# Patient Record
Sex: Male | Born: 1965 | Race: Black or African American | Hispanic: No | Marital: Married | Smoking: Former smoker
Health system: Southern US, Community
[De-identification: ages and names within clinical notes are randomized; demographics above are authoritative.]

## PROBLEM LIST (undated history)

## (undated) DIAGNOSIS — E7800 Pure hypercholesterolemia, unspecified: Secondary | ICD-10-CM

## (undated) DIAGNOSIS — S43429A Sprain of unspecified rotator cuff capsule, initial encounter: Secondary | ICD-10-CM

## (undated) DIAGNOSIS — M1990 Unspecified osteoarthritis, unspecified site: Secondary | ICD-10-CM

## (undated) HISTORY — PX: SHOULDER ARTHROSCOPY: SHX128

---

## 2009-08-15 ENCOUNTER — Emergency Department (HOSPITAL_BASED_OUTPATIENT_CLINIC_OR_DEPARTMENT_OTHER): Admission: EM | Admit: 2009-08-15 | Discharge: 2009-08-15 | Payer: Self-pay | Admitting: Emergency Medicine

## 2009-11-21 ENCOUNTER — Encounter: Admission: RE | Admit: 2009-11-21 | Discharge: 2009-11-21 | Payer: Self-pay | Admitting: Orthopedic Surgery

## 2010-08-13 HISTORY — PX: HAND SURGERY: SHX662

## 2010-09-03 ENCOUNTER — Encounter: Payer: Self-pay | Admitting: Orthopedic Surgery

## 2011-08-14 DIAGNOSIS — S43429A Sprain of unspecified rotator cuff capsule, initial encounter: Secondary | ICD-10-CM

## 2011-08-14 HISTORY — DX: Sprain of unspecified rotator cuff capsule, initial encounter: S43.429A

## 2012-01-07 ENCOUNTER — Emergency Department (HOSPITAL_BASED_OUTPATIENT_CLINIC_OR_DEPARTMENT_OTHER)
Admission: EM | Admit: 2012-01-07 | Discharge: 2012-01-07 | Disposition: A | Discharge status: Home / Self Care | Payer: 59 | Attending: Emergency Medicine | Admitting: Emergency Medicine

## 2012-01-07 ENCOUNTER — Encounter (HOSPITAL_BASED_OUTPATIENT_CLINIC_OR_DEPARTMENT_OTHER): Payer: Self-pay | Admitting: *Deleted

## 2012-01-07 DIAGNOSIS — K0510 Chronic gingivitis, plaque induced: Secondary | ICD-10-CM

## 2012-01-07 DIAGNOSIS — E7800 Pure hypercholesterolemia, unspecified: Secondary | ICD-10-CM | POA: Insufficient documentation

## 2012-01-07 DIAGNOSIS — K047 Periapical abscess without sinus: Secondary | ICD-10-CM

## 2012-01-07 DIAGNOSIS — K056 Periodontal disease, unspecified: Secondary | ICD-10-CM | POA: Insufficient documentation

## 2012-01-07 HISTORY — DX: Pure hypercholesterolemia, unspecified: E78.00

## 2012-01-07 MED ORDER — CHLORHEXIDINE GLUCONATE 0.12 % MT SOLN: 15.0000 mL | Freq: Two times a day (BID) | OROMUCOSAL | Status: AC

## 2012-01-07 MED ORDER — HYDROCODONE-ACETAMINOPHEN 5-325 MG PO TABS: 1.0000 | ORAL_TABLET | Freq: Four times a day (QID) | ORAL | Status: AC | PRN

## 2012-01-07 MED ORDER — PENICILLIN V POTASSIUM 500 MG PO TABS: 500.0000 mg | ORAL_TABLET | Freq: Four times a day (QID) | ORAL | Status: AC

## 2012-01-07 NOTE — ED Notes (Signed)
Pt amb to triage with quick steady gait in nad. Pt reports left lower oral swelling to his gums x yesterday, denies any fevers or other c/o.

## 2012-01-07 NOTE — Discharge Instructions (Signed)
Dental Pain  A tooth ache may be caused by cavities (tooth decay). Cavities expose the nerve of the tooth to air and hot or cold temperatures. It may come from an infection or abscess (also called a boil or furuncle) around your tooth. It is also often caused by dental caries (tooth decay). This causes the pain you are having.  DIAGNOSIS   Your caregiver can diagnose this problem by exam.  TREATMENT   · If caused by an infection, it may be treated with medications which kill germs (antibiotics) and pain medications as prescribed by your caregiver. Take medications as directed.  · Only take over-the-counter or prescription medicines for pain, discomfort, or fever as directed by your caregiver.  · Whether the tooth ache today is caused by infection or dental disease, you should see your dentist as soon as possible for further care.  SEEK MEDICAL CARE IF:  The exam and treatment you received today has been provided on an emergency basis only. This is not a substitute for complete medical or dental care. If your problem worsens or new problems (symptoms) appear, and you are unable to meet with your dentist, call or return to this location.  SEEK IMMEDIATE MEDICAL CARE IF:   · You have a fever.  · You develop redness and swelling of your face, jaw, or neck.  · You are unable to open your mouth.  · You have severe pain uncontrolled by pain medicine.  MAKE SURE YOU:   · Understand these instructions.  · Will watch your condition.  · Will get help right away if you are not doing well or get worse.  Document Released: 07/30/2005 Document Revised: 07/19/2011 Document Reviewed: 03/17/2008  Exit[employer]® Patient Information ©2012 [employer].

## 2012-01-07 NOTE — ED Provider Notes (Signed)
I saw and evaluated the patient, reviewed the resident's note and I agree with the findings and plan.  Pt with gum disease, periodontal disease, mild facial swelling, without trismus, fever, and no obvious gumline fluctuance that is amenable to I&D in my opinion.  Oral abx and analgesics are ok.  Pt has an appt with a dentist tomorrow by his report.    Gavin Pound. Ulysees Robarts, MD 01/07/12 1512

## 2012-01-07 NOTE — ED Notes (Signed)
rx x 3 given for peridex, norco and pcn- d/c with ride

## 2012-01-07 NOTE — ED Provider Notes (Signed)
History     CSN: [id number]  Arrival date & time 01/07/12  1411   First MD Initiated Contact with Patient 01/07/12 1436      Chief Complaint  Patient presents with  . Oral Swelling    (Consider location/radiation/quality/duration/timing/severity/associated sxs/prior treatment) HPI 46 yo male with history of tooth and gum disease who presents with complaint of gum swelling and pain for the past couple of days.  Is planning on having some teeth extracted within the next couple of weeks once he gets the money.  He denies any difficulty swallowing, fever, nausea.   Past Medical History  Diagnosis Date  . Hypercholesteremia     History reviewed. No pertinent past surgical history.  History reviewed. No pertinent family history.  History  Substance Use Topics  . Smoking status: Not on file  . Smokeless tobacco: Not on file  . Alcohol Use:       Review of Systems  HENT: Positive for dental problem. Negative for sore throat, facial swelling, drooling and trouble swallowing.   Respiratory: Negative for shortness of breath and stridor.   Cardiovascular: Negative for chest pain.    Allergies  Review of patient's allergies indicates no known allergies.  Home Medications   Current Outpatient Rx  Name Route Sig Dispense Refill  . PHENTERMINE HCL 15 MG PO CAPS Oral Take 15 mg by mouth every morning.      BP 125/66  Pulse 82  Temp(Src) 98.5 F (36.9 C) (Oral)  Resp 18  Ht 5\' 11"  (1.803 m)  Wt 242 lb (109.77 kg)  BMI 33.75 kg/m2  SpO2 100%  Physical Exam  Constitutional: He is oriented to person, place, and time. He appears well-developed and well-nourished.  HENT:  Head: Normocephalic and atraumatic.       Fair dentition with evidence of gum disease.  There is swelling and erosion around the lower gum line on the L without, no visible pocket of pus to be drained.   No jaw swelling or tenderness.     Neck: Neck supple.  Lymphadenopathy:    He has no cervical  adenopathy.  Neurological: He is alert and oriented to person, place, and time.    ED Course  Procedures (including critical care time)  Labs Reviewed - No data to display No results found.   No diagnosis found.    MDM  Gingival disease, with possible abscess.  Swelling present, but unable to visualize area that would be amenable to drainage.  Will place on penicillin and hydrocodone for pain control.  Will give CHG mouthwash as well to help in recurrence of symptoms.          Everrett Coombe, DO 01/07/12 1509  Everrett Coombe, DO 01/07/12 [id number]

## 2012-08-10 ENCOUNTER — Emergency Department (HOSPITAL_BASED_OUTPATIENT_CLINIC_OR_DEPARTMENT_OTHER)
Admission: EM | Admit: 2012-08-10 | Discharge: 2012-08-10 | Disposition: A | Discharge status: Home / Self Care | Payer: 59 | Attending: Emergency Medicine | Admitting: Emergency Medicine

## 2012-08-10 ENCOUNTER — Emergency Department (HOSPITAL_BASED_OUTPATIENT_CLINIC_OR_DEPARTMENT_OTHER): Payer: 59

## 2012-08-10 DIAGNOSIS — E7800 Pure hypercholesterolemia, unspecified: Secondary | ICD-10-CM | POA: Insufficient documentation

## 2012-08-10 DIAGNOSIS — M25519 Pain in unspecified shoulder: Secondary | ICD-10-CM | POA: Insufficient documentation

## 2012-08-10 DIAGNOSIS — Z79899 Other long term (current) drug therapy: Secondary | ICD-10-CM | POA: Insufficient documentation

## 2012-08-10 MED ORDER — PREDNISONE 10 MG PO TABS: 20.0000 mg | ORAL_TABLET | Freq: Two times a day (BID) | ORAL | Status: DC

## 2012-08-10 MED ORDER — HYDROCODONE-ACETAMINOPHEN 5-500 MG PO TABS: 1.0000 | ORAL_TABLET | Freq: Four times a day (QID) | ORAL | Status: AC | PRN

## 2012-08-10 MED ORDER — KETOROLAC TROMETHAMINE 60 MG/2ML IM SOLN
60.0000 mg | Freq: Once | INTRAMUSCULAR | Status: AC
  Administered 2012-08-10: 60 mg via INTRAMUSCULAR
  Filled 2012-08-10: qty 2

## 2012-08-10 NOTE — ED Notes (Signed)
Patient C/O pain in his left shoulder that has been intermittent.  States that left shoulder pain returned last week. States that his rage of motion is limited.  States that he thinks that his left arm is getting weaker.  Left shoulder is non-tender to palpation.

## 2012-08-10 NOTE — ED Provider Notes (Signed)
History     CSN: [id number]  Arrival date & time 08/10/12  1006   First MD Initiated Contact with Patient 08/10/12 1032      Chief Complaint  Patient presents with  . Shoulder Pain    (Consider location/radiation/quality/duration/timing/severity/associated sxs/prior treatment) HPI Comments: Patient with history of longstanding shoulder pain.  He presents with a worsening over the past two days.  He denies any injury or trauma.  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  No numbness or tingling.    Patient is a 46 y.o. male presenting with shoulder pain. The history is provided by the patient.  Shoulder Pain This is a chronic problem. The current episode started 2 days ago. The problem occurs constantly. The problem has been rapidly worsening. Pertinent negatives include no chest pain. Exacerbated by: movement, palpation. Nothing relieves the symptoms. He has tried nothing for the symptoms.    Past Medical History  Diagnosis Date  . Hypercholesteremia     No past surgical history on file.  No family history on file.  History  Substance Use Topics  . Smoking status: Not on file  . Smokeless tobacco: Not on file  . Alcohol Use:       Review of Systems  Cardiovascular: Negative for chest pain.  All other systems reviewed and are negative.    Allergies  Review of patient's allergies indicates no known allergies.  Home Medications   Current Outpatient Rx  Name  Route  Sig  Dispense  Refill  . PHENTERMINE HCL 15 MG PO CAPS   Oral   Take 15 mg by mouth every morning.           BP 156/76  Pulse 88  Temp 98 F (36.7 C) (Oral)  Resp 18  Ht 5\' 11"  (1.803 m)  Wt 240 lb (108.863 kg)  BMI 33.47 kg/m2  SpO2 98%  Physical Exam  Nursing note and vitals reviewed. Constitutional: He is oriented to person, place, and time. He appears well-developed and well-nourished. No distress.  HENT:  Head: Normocephalic and atraumatic.  Neck:  Normal range of motion. Neck supple.  Musculoskeletal:       There is ttp over the lateral aspect of the shoulder.  There is no gross abnormality.  There is good range of motion with pain with internal rotation.  The distal pulses, motor, and sensory are all intact.  Neurological: He is alert and oriented to person, place, and time.  Skin: Skin is warm and dry. He is not diaphoretic.    ED Course  Procedures (including critical care time)  Labs Reviewed - No data to display Dg Shoulder Left  08/10/2012  *RADIOLOGY REPORT*  Clinical Data: Shoulder pain  LEFT SHOULDER - 2+ VIEW  Comparison: None  Findings: There is no evidence of fracture or dislocation. Mild osteoarthritis involves the glenohumeral joint.  Soft tissues are unremarkable.  IMPRESSION: No acute findings.   Original Report Authenticated By: Signa Kell, M.D.      No diagnosis found.    MDM  The xrays look okay.  Will treat with prednisone, pain meds, rest.  Will also apply sling to rest the arm.  Follow up prn with pcp if not improving to discuss referrals/further testing as indicated.          Geoffery Lyons, MD 08/10/12 1130

## 2013-04-02 ENCOUNTER — Ambulatory Visit: Payer: 59 | Attending: Specialist | Admitting: Rehabilitation

## 2013-04-02 DIAGNOSIS — M25619 Stiffness of unspecified shoulder, not elsewhere classified: Secondary | ICD-10-CM | POA: Insufficient documentation

## 2013-04-02 DIAGNOSIS — IMO0001 Reserved for inherently not codable concepts without codable children: Secondary | ICD-10-CM | POA: Insufficient documentation

## 2013-04-02 DIAGNOSIS — M25519 Pain in unspecified shoulder: Secondary | ICD-10-CM | POA: Insufficient documentation

## 2013-04-07 ENCOUNTER — Ambulatory Visit: Payer: 59 | Admitting: Physical Therapy

## 2013-04-09 ENCOUNTER — Ambulatory Visit: Payer: 59 | Admitting: Physical Therapy

## 2013-04-14 ENCOUNTER — Ambulatory Visit: Payer: 59 | Admitting: Physical Therapy

## 2013-04-16 ENCOUNTER — Ambulatory Visit: Payer: 59 | Admitting: Physical Therapy

## 2013-04-17 ENCOUNTER — Ambulatory Visit: Payer: 59 | Attending: Specialist | Admitting: Physical Therapy

## 2013-04-17 DIAGNOSIS — IMO0001 Reserved for inherently not codable concepts without codable children: Secondary | ICD-10-CM | POA: Insufficient documentation

## 2013-04-17 DIAGNOSIS — M25619 Stiffness of unspecified shoulder, not elsewhere classified: Secondary | ICD-10-CM | POA: Insufficient documentation

## 2013-04-17 DIAGNOSIS — M25519 Pain in unspecified shoulder: Secondary | ICD-10-CM | POA: Insufficient documentation

## 2013-04-20 ENCOUNTER — Ambulatory Visit: Payer: 59 | Admitting: Physical Therapy

## 2013-04-21 ENCOUNTER — Ambulatory Visit: Payer: 59 | Admitting: Physical Therapy

## 2013-04-23 ENCOUNTER — Ambulatory Visit: Payer: 59 | Admitting: Physical Therapy

## 2013-04-28 ENCOUNTER — Ambulatory Visit: Payer: 59 | Admitting: Physical Therapy

## 2013-04-30 ENCOUNTER — Ambulatory Visit: Payer: 59 | Admitting: Physical Therapy

## 2013-05-04 ENCOUNTER — Ambulatory Visit: Payer: 59 | Admitting: Physical Therapy

## 2013-05-06 ENCOUNTER — Ambulatory Visit: Payer: 59 | Admitting: Physical Therapy

## 2013-05-07 ENCOUNTER — Ambulatory Visit: Payer: 59 | Admitting: Physical Therapy

## 2013-05-18 ENCOUNTER — Ambulatory Visit: Payer: 59 | Attending: Specialist | Admitting: Physical Therapy

## 2013-05-18 DIAGNOSIS — IMO0001 Reserved for inherently not codable concepts without codable children: Secondary | ICD-10-CM | POA: Insufficient documentation

## 2013-05-18 DIAGNOSIS — M25619 Stiffness of unspecified shoulder, not elsewhere classified: Secondary | ICD-10-CM | POA: Insufficient documentation

## 2013-05-18 DIAGNOSIS — M25519 Pain in unspecified shoulder: Secondary | ICD-10-CM | POA: Insufficient documentation

## 2013-05-20 ENCOUNTER — Ambulatory Visit: Payer: 59 | Admitting: Physical Therapy

## 2013-05-21 ENCOUNTER — Ambulatory Visit: Payer: 59 | Admitting: Physical Therapy

## 2013-05-25 ENCOUNTER — Ambulatory Visit: Payer: 59 | Admitting: Physical Therapy

## 2013-05-29 ENCOUNTER — Ambulatory Visit: Payer: 59 | Admitting: Physical Therapy

## 2014-11-11 DIAGNOSIS — M75122 Complete rotator cuff tear or rupture of left shoulder, not specified as traumatic: Secondary | ICD-10-CM | POA: Insufficient documentation

## 2014-11-11 DIAGNOSIS — S46119A Strain of muscle, fascia and tendon of long head of biceps, unspecified arm, initial encounter: Secondary | ICD-10-CM | POA: Insufficient documentation

## 2014-11-11 DIAGNOSIS — M7540 Impingement syndrome of unspecified shoulder: Secondary | ICD-10-CM | POA: Insufficient documentation

## 2014-11-11 DIAGNOSIS — M19012 Primary osteoarthritis, left shoulder: Secondary | ICD-10-CM | POA: Insufficient documentation

## 2015-01-28 DIAGNOSIS — Z01818 Encounter for other preprocedural examination: Secondary | ICD-10-CM | POA: Insufficient documentation

## 2015-01-28 DIAGNOSIS — M25512 Pain in left shoulder: Secondary | ICD-10-CM | POA: Insufficient documentation

## 2016-04-15 ENCOUNTER — Emergency Department (HOSPITAL_BASED_OUTPATIENT_CLINIC_OR_DEPARTMENT_OTHER)
Admission: EM | Admit: 2016-04-15 | Discharge: 2016-04-15 | Disposition: A | Discharge status: Home / Self Care | Payer: 59 | Attending: Emergency Medicine | Admitting: Emergency Medicine

## 2016-04-15 ENCOUNTER — Encounter (HOSPITAL_BASED_OUTPATIENT_CLINIC_OR_DEPARTMENT_OTHER): Payer: Self-pay | Admitting: *Deleted

## 2016-04-15 DIAGNOSIS — Z79899 Other long term (current) drug therapy: Secondary | ICD-10-CM | POA: Diagnosis not present

## 2016-04-15 DIAGNOSIS — S4992XA Unspecified injury of left shoulder and upper arm, initial encounter: Secondary | ICD-10-CM | POA: Diagnosis present

## 2016-04-15 DIAGNOSIS — Y92410 Unspecified street and highway as the place of occurrence of the external cause: Secondary | ICD-10-CM | POA: Insufficient documentation

## 2016-04-15 DIAGNOSIS — Y939 Activity, unspecified: Secondary | ICD-10-CM | POA: Diagnosis not present

## 2016-04-15 DIAGNOSIS — Y999 Unspecified external cause status: Secondary | ICD-10-CM | POA: Diagnosis not present

## 2016-04-15 DIAGNOSIS — Z87891 Personal history of nicotine dependence: Secondary | ICD-10-CM | POA: Insufficient documentation

## 2016-04-15 DIAGNOSIS — S46912A Strain of unspecified muscle, fascia and tendon at shoulder and upper arm level, left arm, initial encounter: Secondary | ICD-10-CM | POA: Insufficient documentation

## 2016-04-15 HISTORY — DX: Sprain of unspecified rotator cuff capsule, initial encounter: S43.429A

## 2016-04-15 MED ORDER — METHOCARBAMOL 500 MG PO TABS
500.0000 mg | ORAL_TABLET | Freq: Once | ORAL | Status: AC
  Administered 2016-04-15: 500 mg via ORAL
  Filled 2016-04-15: qty 1

## 2016-04-15 MED ORDER — METHOCARBAMOL 500 MG PO TABS: 500.0000 mg | ORAL_TABLET | Freq: Two times a day (BID) | ORAL | 0 refills | Status: DC

## 2016-04-15 MED ORDER — KETOROLAC TROMETHAMINE 60 MG/2ML IM SOLN
60.0000 mg | Freq: Once | INTRAMUSCULAR | Status: AC
  Administered 2016-04-15: 60 mg via INTRAMUSCULAR
  Filled 2016-04-15: qty 2

## 2016-04-15 MED ORDER — DICLOFENAC SODIUM 75 MG PO TBEC: 75.0000 mg | DELAYED_RELEASE_TABLET | Freq: Two times a day (BID) | ORAL | 0 refills | Status: DC

## 2016-04-15 NOTE — ED Notes (Signed)
MD at bedside. 
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
## 2016-04-24 ENCOUNTER — Ambulatory Visit: Payer: 59 | Admitting: Family Medicine

## 2016-04-25 ENCOUNTER — Encounter: Payer: Self-pay | Admitting: Family Medicine

## 2016-04-25 ENCOUNTER — Ambulatory Visit (HOSPITAL_BASED_OUTPATIENT_CLINIC_OR_DEPARTMENT_OTHER)
Admission: RE | Admit: 2016-04-25 | Discharge: 2016-04-25 | Disposition: A | Discharge status: Home / Self Care | Payer: 59 | Source: Ambulatory Visit | Attending: Family Medicine | Admitting: Family Medicine

## 2016-04-25 ENCOUNTER — Ambulatory Visit (INDEPENDENT_AMBULATORY_CARE_PROVIDER_SITE_OTHER): Payer: 59 | Admitting: Family Medicine

## 2016-04-25 VITALS — BP 109/77 | HR 128 | Ht 71.0 in | Wt 246.0 lb

## 2016-04-25 DIAGNOSIS — M25512 Pain in left shoulder: Secondary | ICD-10-CM

## 2016-04-25 MED ORDER — MELOXICAM 15 MG PO TABS: 15.0000 mg | ORAL_TABLET | Freq: Every day | ORAL | 2 refills | Status: DC

## 2016-04-25 MED ORDER — OXYCODONE-ACETAMINOPHEN 10-325 MG PO TABS: 1.0000 | ORAL_TABLET | Freq: Four times a day (QID) | ORAL | 0 refills | Status: DC | PRN

## 2016-04-25 MED ORDER — METHOCARBAMOL 500 MG PO TABS: 500.0000 mg | ORAL_TABLET | Freq: Three times a day (TID) | ORAL | 1 refills | Status: DC | PRN

## 2016-04-25 NOTE — Patient Instructions (Signed)
You have a rotator cuff strain and contusion. Take meloxicam 15 mg daily with food for pain and inflammation. Robaxin as needed for spasms. Oxycodone as needed for severe pain (we do not refill this medicine). Light duty as written in your note. We will repeat the MRI of your shoulder as well to assess for a new rotator cuff tear. Follow up will depend on your result.

## 2016-04-30 ENCOUNTER — Telehealth: Payer: Self-pay | Admitting: Family Medicine

## 2016-04-30 NOTE — Addendum Note (Signed)
Addended by: Kathi SimpersWISE, Dessiree Sze F on: 04/30/2016 09:58 AM   Modules accepted: Orders
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
## 2016-05-05 ENCOUNTER — Ambulatory Visit (HOSPITAL_BASED_OUTPATIENT_CLINIC_OR_DEPARTMENT_OTHER)
Admission: RE | Admit: 2016-05-05 | Discharge: 2016-05-05 | Disposition: A | Discharge status: Home / Self Care | Payer: 59 | Source: Ambulatory Visit | Attending: Family Medicine | Admitting: Family Medicine

## 2016-05-05 DIAGNOSIS — M75102 Unspecified rotator cuff tear or rupture of left shoulder, not specified as traumatic: Secondary | ICD-10-CM | POA: Insufficient documentation

## 2016-05-05 DIAGNOSIS — M25512 Pain in left shoulder: Secondary | ICD-10-CM | POA: Diagnosis not present

## 2016-05-05 DIAGNOSIS — M6258 Muscle wasting and atrophy, not elsewhere classified, other site: Secondary | ICD-10-CM | POA: Diagnosis not present

## 2016-05-05 DIAGNOSIS — M19012 Primary osteoarthritis, left shoulder: Secondary | ICD-10-CM | POA: Insufficient documentation

## 2016-05-05 DIAGNOSIS — M778 Other enthesopathies, not elsewhere classified: Secondary | ICD-10-CM | POA: Diagnosis not present

## 2016-05-07 ENCOUNTER — Telehealth: Payer: Self-pay | Admitting: Family Medicine

## 2016-05-07 NOTE — Telephone Encounter (Signed)
Spoke with patient - see addendum to note.

## 2016-05-07 NOTE — Addendum Note (Signed)
Addended by: Kathi SimpersWISE, Camaria Gerald F on: 05/07/2016 05:15 PM   Modules accepted: Orders

## 2016-05-07 NOTE — Telephone Encounter (Signed)
Called to give him MRI results - no answer and voicemail not set up to be able to leave a message.

## 2016-05-08 NOTE — Telephone Encounter (Signed)
MRI completed.

## 2016-05-15 ENCOUNTER — Telehealth: Payer: Self-pay | Admitting: Family Medicine

## 2016-05-15 NOTE — Telephone Encounter (Signed)
Spoke to patient and gave him phone number to  Physical Therapy. Patient to call to set up appointment.

## 2016-05-23 ENCOUNTER — Telehealth: Payer: Self-pay | Admitting: Family Medicine

## 2016-05-24 NOTE — Telephone Encounter (Signed)
If the robaxin and meloxicam aren't helping enough we can switch from these to something different (like flexeril and diclofenac).  He can take tylenol and a topical medicine like capsaicin in addition to the robaxin and meloxicam too.

## 2016-05-25 ENCOUNTER — Encounter: Payer: Self-pay | Admitting: Family Medicine

## 2016-05-25 ENCOUNTER — Ambulatory Visit: Payer: 59 | Attending: Family Medicine | Admitting: Physical Therapy

## 2016-05-25 ENCOUNTER — Encounter (INDEPENDENT_AMBULATORY_CARE_PROVIDER_SITE_OTHER): Payer: Self-pay

## 2016-05-25 ENCOUNTER — Ambulatory Visit (INDEPENDENT_AMBULATORY_CARE_PROVIDER_SITE_OTHER): Payer: 59 | Admitting: Family Medicine

## 2016-05-25 VITALS — BP 112/70 | HR 67 | Ht 71.0 in | Wt 248.0 lb

## 2016-05-25 DIAGNOSIS — M25512 Pain in left shoulder: Secondary | ICD-10-CM

## 2016-05-25 DIAGNOSIS — M25612 Stiffness of left shoulder, not elsewhere classified: Secondary | ICD-10-CM | POA: Diagnosis present

## 2016-05-25 DIAGNOSIS — R293 Abnormal posture: Secondary | ICD-10-CM

## 2016-05-25 DIAGNOSIS — M6281 Muscle weakness (generalized): Secondary | ICD-10-CM | POA: Insufficient documentation

## 2016-05-25 DIAGNOSIS — M25511 Pain in right shoulder: Secondary | ICD-10-CM | POA: Diagnosis not present

## 2016-05-25 MED ORDER — TRAMADOL HCL 50 MG PO TABS: 50.0000 mg | ORAL_TABLET | Freq: Three times a day (TID) | ORAL | 0 refills | Status: DC | PRN

## 2016-05-25 MED ORDER — METHYLPREDNISOLONE ACETATE 40 MG/ML IJ SUSP
40.0000 mg | Freq: Once | INTRAMUSCULAR | Status: AC
  Administered 2016-05-25: 40 mg via INTRA_ARTICULAR

## 2016-05-25 MED FILL — traMADol HCL 50 MG TABS: 50 | 17 days supply | Qty: 50 | Fill #0
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
## 2016-05-25 NOTE — Patient Instructions (Signed)
You have rotator cuff impingement, tendinitis of your right shoulder. You were given an injection here. Try to avoid painful activities (overhead activities, lifting with extended arm) as much as possible. Meloxicam as you have been for pain and inflammation. Robaxin as needed for spasms. Tramadol as needed for severe pain - we do not refill this medicine. Can take tylenol in addition to this. Start physical therapy with transition to home exercise program. Do home exercise program with theraband and scapular stabilization exercises daily - these are very important for long term relief even if an injection was given. Can consider nitro patches if not improving. Follow up with me in 6 weeks.

## 2016-05-28 ENCOUNTER — Ambulatory Visit: Payer: 59 | Admitting: Family Medicine

## 2016-05-30 DIAGNOSIS — M25511 Pain in right shoulder: Secondary | ICD-10-CM | POA: Insufficient documentation

## 2016-05-30 NOTE — Assessment & Plan Note (Signed)
2/2 rotator cuff impingement.  Discussed options - starting physical therapy for this as well.  Subacromial injection given today.  Consider nitro patches if not improving.  F/u in 6 weeks.  After informed written consent, patient was seated on exam table. Right shoulder was prepped with alcohol swab and utilizing posterior approach, patient's right subacromial space was injected with 3:1 marcaine: depomedrol. Patient tolerated the procedure well without immediate complications.
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
## 2016-06-01 ENCOUNTER — Ambulatory Visit: Payer: 59 | Admitting: Physical Therapy

## 2016-06-15 ENCOUNTER — Ambulatory Visit: Payer: 59 | Attending: Family Medicine | Admitting: Physical Therapy

## 2016-06-15 DIAGNOSIS — M25612 Stiffness of left shoulder, not elsewhere classified: Secondary | ICD-10-CM | POA: Insufficient documentation

## 2016-06-15 DIAGNOSIS — M6281 Muscle weakness (generalized): Secondary | ICD-10-CM | POA: Diagnosis present

## 2016-06-15 DIAGNOSIS — M25512 Pain in left shoulder: Secondary | ICD-10-CM | POA: Diagnosis present

## 2016-06-15 DIAGNOSIS — R293 Abnormal posture: Secondary | ICD-10-CM | POA: Insufficient documentation

## 2016-06-15 NOTE — Therapy (Signed)
[facility] [address]  [address] [place], [place], [address] Phone: [phone]   Fax:  [phone]  Physical Therapy Treatment  Patient Details  Name: Brandon Hodge MRN: [id number] Date of Birth: 12/29/1965 Referring Provider: Norton BlizzardShane Hudnall, MD  Encounter Date: 06/15/2016      PT End of Session - 06/15/16 0846    Visit Number 2   Number of Visits 12   Date for PT Re-Evaluation 07/20/16   Authorization - Number of Visits 60   PT Start Time 0846   PT Stop Time 0930   PT Time Calculation (min) 44 min   Activity Tolerance Patient tolerated treatment well   Behavior During Therapy [facility] for tasks assessed/performed      Past Medical History:  Diagnosis Date  . Hypercholesteremia   . Rotator cuff (capsule) sprain 2013    Past Surgical History:  Procedure Laterality Date  . HAND SURGERY Left 2012    There were no vitals filed for this visit.      Subjective Assessment - 06/15/16 0848    Subjective Pt reports pain has improved significantly with initial HEP, often w/o pain during the day but still up to 8/10 at night.   Patient Stated Goals "Feel better & get back to working out as much as possible"   Currently in Pain? No/denies                   [facility] Treatment/Exercise - 06/15/16 0846      Posture/Postural Control   Posture Comments Frequent cues for proper posture during HEP     Shoulder Exercises: Supine   Horizontal ABduction Both;10 reps;Theraband   Theraband Level (Shoulder Horizontal ABduction) Level 2 (Red)   Horizontal ABduction Limitations Hoolying on 1/2 FR   External Rotation AAROM;Left;10 reps   External Rotation Limitations wand with pillow under upper arm    Flexion AAROM;Left;10 reps   Flexion Limitations wand     Shoulder Exercises: Seated   Retraction AROM;Both;10 reps   External Rotation AAROM;Left;10 reps   External Rotation Limitations wand     Shoulder  Exercises: Standing   Internal Rotation AAROM;Left;10 reps   Internal Rotation Limitations wand   Flexion AAROM;Left;10 reps   Flexion Limitations Wall ladder   ABduction AAROM;Left;10 reps   ABduction Limitations wand   Row Both;10 reps;Theraband   Theraband Level (Shoulder Row) Level 3 (Green)   Retraction Both;10 reps;Theraband   Theraband Level (Shoulder Retraction) Level 3 (Green)     Shoulder Exercises: Pulleys   Flexion 2 minutes   ABduction 2 minutes     Shoulder Exercises: ROM/Strengthening   UBE (Upper Arm Bike) lvl 2.5 fwd/back x 3'      Shoulder Exercises: Stretch   Corner Stretch 2 reps;30 seconds   Corner Stretch Limitations 3 way doorway pec stretch (low/mid/high)   Other Shoulder Stretches L Shoudler IR sleeper stretch 2x30"                PT Education - 06/15/16 0930    Education provided Yes   Education Details Reviewed & updated HEP reinforcing proper postural awareness   Methods Explanation;Demonstration;Handout   Comprehension Verbalized understanding;Returned demonstration;Need further instruction          PT Short Term Goals - 06/15/16 0850      PT SHORT TERM GOAL #1   Title Independent with initial HEP by 06/08/16   Status On-going     PT SHORT TERM GOAL #  2   Title Pt will demonstrate improved awareness of neutral shoulder posture by 06/08/16   Status On-going           PT Long Term Goals - 06/15/16 0850      PT LONG TERM GOAL #1   Title Independent with advanced HEP/gym program by 07/20/16   Status On-going     PT LONG TERM GOAL #2   Title L shoulder ROM WFL w/o limitation d/t pain by 07/20/16   Status On-going     PT LONG TERM GOAL #3   Title L shoulder strength >/= 4/5 for improved function by 07/20/16   Status On-going     PT LONG TERM GOAL #4   Title Pt will report no sleep disturbance due to L shoulder pain by 07/20/16   Status On-going     PT LONG TERM GOAL #5   Title Pt will return to 75% normal performance of  ADL's, household chores and job tasks w/o limitation due to L shoulder pain by 07/20/16   Status On-going               Plan - 06/15/16 0850    Clinical Impression Statement Pt noting improvement in pain and function with initial HEP, but upon review, pt requiring extensive cues/corrections for postural awareness and proper technique with initial HEP exercises, therefore only limited progression of HEP today.   Rehab Potential Good   PT Treatment/Interventions Patient/family education;Neuromuscular re-education;Therapeutic exercise;Manual techniques;Passive range of motion;Taping;Therapeutic activities;Electrical Stimulation;Moist Heat;Cryotherapy;Iontophoresis 4mg /ml Dexamethasone;ADLs/Self Care Home Management   PT Next Visit Plan Postural training; Scapular stabilization; L shoulder ROM & strengthening; Manual therapy & modalities PRN   Consulted and Agree with Plan of Care Patient      Patient will benefit from skilled therapeutic intervention in order to improve the following deficits and impairments:  Pain, Decreased range of motion, Postural dysfunction, Decreased strength, Increased muscle spasms, Impaired UE functional use, Decreased activity tolerance  Visit Diagnosis: Left shoulder pain, unspecified chronicity  Stiffness of left shoulder, not elsewhere classified  Muscle weakness (generalized)  Abnormal posture     Problem List Patient Active Problem List   Diagnosis Date Noted  . Right shoulder pain 05/30/2016  . Left shoulder pain 01/28/2015  . Preop examination 01/28/2015  . Arthritis of left acromioclavicular joint 11/11/2014  . Biceps rupture, proximal 11/11/2014  . Complete tear of left rotator cuff 11/11/2014  . Shoulder impingement 11/11/2014    Marry GuanJoAnne M Kreis, PT, MPT  06/15/2016, 10:42 AM  [facility] [place] [address]  [address] [place], [place], [address] Phone: [phone]   Fax:   [phone]  Name: Brandon Hodge MRN: [id number] Date of Birth: 01/02/1966

## 2016-06-18 ENCOUNTER — Ambulatory Visit: Payer: 59 | Admitting: Physical Therapy

## 2016-06-18 DIAGNOSIS — R293 Abnormal posture: Secondary | ICD-10-CM

## 2016-06-18 DIAGNOSIS — M25512 Pain in left shoulder: Secondary | ICD-10-CM | POA: Diagnosis not present

## 2016-06-18 DIAGNOSIS — M6281 Muscle weakness (generalized): Secondary | ICD-10-CM

## 2016-06-18 DIAGNOSIS — M25612 Stiffness of left shoulder, not elsewhere classified: Secondary | ICD-10-CM

## 2016-06-18 NOTE — Therapy (Signed)
[facility] [place] [address]  [address] [place], [place], [address] Phone: [phone]   Fax:  [phone]  Physical Therapy Treatment  Patient Details  Name: Brandon MandesDouglas Hodge MRN: [id number] Date of Birth: 07/17/1966 Referring Provider: Norton BlizzardShane Hudnall, MD  Encounter Date: 06/18/2016      PT End of Session - 06/18/16 1152    Visit Number 3   Number of Visits 12   Date for PT Re-Evaluation 07/20/16   Authorization - Number of Visits 60   PT Start Time 0857  patient arriving late   PT Stop Time 0930   PT Time Calculation (min) 33 min   Activity Tolerance Patient tolerated treatment well   Behavior During Therapy [facility] for tasks assessed/performed      Past Medical History:  Diagnosis Date  . Hypercholesteremia   . Rotator cuff (capsule) sprain 2013    Past Surgical History:  Procedure Laterality Date  . HAND SURGERY Left 2012    There were no vitals filed for this visit.      Subjective Assessment - 06/18/16 0859    Subjective Patient reporting shoulder is feeling well. Felt like he could raise his arm higher than normal on Saturday.    Patient Stated Goals "Feel better & get back to working out as much as possible"   Currently in Pain? No/denies                         [facility] Adult PT Treatment/Exercise - 06/18/16 0900      Posture/Postural Control   Posture Comments Frequent cues for proper posture during HEP     Shoulder Exercises: Seated   Retraction AROM;Both;10 reps   Flexion AAROM;15 reps   Flexion Limitations with cane and mirror due to excessive shoulder hiking,     Shoulder Exercises: Sidelying   External Rotation Left;15 reps;Weights   External Rotation Weight (lbs) 1   External Rotation Limitations able to go to neutral     Shoulder Exercises: Standing   External Rotation Left;15 reps;Theraband   Theraband Level (Shoulder External Rotation) Level 1 (Yellow)   Internal Rotation  Left;15 reps;Theraband   Theraband Level (Shoulder Internal Rotation) Level 1 (Yellow)   Flexion AAROM;Left;5 reps   Flexion Limitations wall ladder   ABduction AAROM;Left;10 reps   ABduction Limitations wall ladder   Row Both;15 reps;Theraband   Theraband Level (Shoulder Row) Level 3 (Green)     Shoulder Exercises: ROM/Strengthening   UBE (Upper Arm Bike) lvl 2.5 fwd/back x 3'    Wall Pushups 15 reps   Pushups Limitations 2 sets with reduced elbow extension     Shoulder Exercises: Stretch   Corner Stretch 2 reps;30 seconds   Corner Stretch Limitations 3 way doorway pec stretch (low/mid/high)                  PT Short Term Goals - 06/15/16 0850      PT SHORT TERM GOAL #1   Title Independent with initial HEP by 06/08/16   Status On-going     PT SHORT TERM GOAL #2   Title Pt will demonstrate improved awareness of neutral shoulder posture by 06/08/16   Status On-going           PT Long Term Goals - 06/15/16 0850      PT LONG TERM GOAL #1   Title Independent with advanced HEP/gym program by 07/20/16   Status On-going  PT LONG TERM GOAL #2   Title L shoulder ROM WFL w/o limitation d/t pain by 07/20/16   Status On-going     PT LONG TERM GOAL #3   Title L shoulder strength >/= 4/5 for improved function by 07/20/16   Status On-going     PT LONG TERM GOAL #4   Title Pt will report no sleep disturbance due to L shoulder pain by 07/20/16   Status On-going     PT LONG TERM GOAL #5   Title Pt will return to 75% normal performance of ADL's, household chores and job tasks w/o limitation due to L shoulder pain by 07/20/16   Status On-going               Plan - 06/18/16 1152    Clinical Impression Statement Patient reporitng no pain symptoms and ability to use UE to a greater extent over the weekend. Patient dmonstrating excessive shoulder hiking/compensation with all AAROM/AROM while performing forward flexion and abduction requiring use of mirror for  feedback for proper movement with some carryover. Patient to continue to benefit from skilled PT intervention to progress funcitonal use of UE.    Rehab Potential Good   PT Frequency 2x / week   PT Duration 6 weeks   PT Treatment/Interventions Patient/family education;Neuromuscular re-education;Therapeutic exercise;Manual techniques;Passive range of motion;Taping;Therapeutic activities;Electrical Stimulation;Moist Heat;Cryotherapy;Iontophoresis 4mg /ml Dexamethasone;ADLs/Self Care Home Management   PT Next Visit Plan Postural training; Scapular stabilization; L shoulder ROM & strengthening; Manual therapy & modalities PRN   Consulted and Agree with Plan of Care Patient      Patient will benefit from skilled therapeutic intervention in order to improve the following deficits and impairments:  Pain, Decreased range of motion, Postural dysfunction, Decreased strength, Increased muscle spasms, Impaired UE functional use, Decreased activity tolerance  Visit Diagnosis: Left shoulder pain, unspecified chronicity  Stiffness of left shoulder, not elsewhere classified  Muscle weakness (generalized)  Abnormal posture     Problem List Patient Active Problem List   Diagnosis Date Noted  . Right shoulder pain 05/30/2016  . Left shoulder pain 01/28/2015  . Preop examination 01/28/2015  . Arthritis of left acromioclavicular joint 11/11/2014  . Biceps rupture, proximal 11/11/2014  . Complete tear of left rotator cuff 11/11/2014  . Shoulder impingement 11/11/2014       Brandon Hodge, PT, DPT 06/18/16 11:56 AM     [facility] [place] [address]  [address] [place], [place], [address] Phone: [phone]   Fax:  [phone]  Name: Brandon MandesDouglas Hodge MRN: [id number] Date of Birth: 06/07/1966

## 2016-06-22 ENCOUNTER — Ambulatory Visit: Payer: 59

## 2016-06-22 DIAGNOSIS — M25512 Pain in left shoulder: Secondary | ICD-10-CM

## 2016-06-22 DIAGNOSIS — M25612 Stiffness of left shoulder, not elsewhere classified: Secondary | ICD-10-CM

## 2016-06-22 DIAGNOSIS — R293 Abnormal posture: Secondary | ICD-10-CM

## 2016-06-22 DIAGNOSIS — M6281 Muscle weakness (generalized): Secondary | ICD-10-CM

## 2016-06-22 NOTE — Therapy (Signed)
[facility] [place] [address]  [address] [place], [place], [address] Phone: [phone]   Fax:  [phone]  Physical Therapy Treatment  Patient Details  Name: Brandon Hodge MRN: [id number] Date of Birth: 10/01/1965 Referring Provider: Norton BlizzardShane Hudnall, MD  Encounter Date: 06/22/2016      PT End of Session - 06/22/16 0859    Visit Number 4   Number of Visits 12   Date for PT Re-Evaluation 07/20/16   Authorization - Number of Visits 60   PT Start Time [phone]   PT Stop Time 0930   PT Time Calculation (min) 38 min   Activity Tolerance Patient tolerated treatment well   Behavior During Therapy [facility] for tasks assessed/performed      Past Medical History:  Diagnosis Date  . Hypercholesteremia   . Rotator cuff (capsule) sprain 2013    Past Surgical History:  Procedure Laterality Date  . HAND SURGERY Left 2012    There were no vitals filed for this visit.      Subjective Assessment - 06/22/16 0856    Subjective Pt. reporting constant aching pain in L shoulder, "all the time".     Patient Stated Goals "Feel better & get back to working out as much as possible"   Currently in Pain? Yes   Pain Score 7    Pain Location Shoulder   Pain Orientation Left;Anterior   Pain Descriptors / Indicators Aching   Pain Type Acute pain;Chronic pain   Pain Onset More than a month ago   Multiple Pain Sites No            [facility] Adult PT Treatment/Exercise - 06/22/16 0909      Shoulder Exercises: Supine   Horizontal ABduction Both;15 reps   Theraband Level (Shoulder Horizontal ABduction) Level 2 (Red);Level 4 (Blue)  with scapular squeeze laying on 1/2 foam bolster   Horizontal ABduction Limitations Hoolying on 1/2 FR   External Rotation Left;AROM;15 reps   Theraband Level (Shoulder External Rotation) Level 2 (Red)   External Rotation Limitations wand with pillow under upper arm    Other Supine Exercises supine L shoulder ER stretch  with wand 5" x 15 reps; tactile and verbal cues for proper positioning and to prevent elbow ext.     Shoulder Exercises: Pulleys   Flexion 2 minutes  pool noodle behind upper back    ABduction 2 minutes  pool noodle behind upper back      Shoulder Exercises: ROM/Strengthening   UBE (Upper Arm Bike) lvl 3.0 fwd/back x 3'      Shoulder Exercises: Geologist, engineeringtretch   Corner Stretch --   Corner Stretch Limitations 3 way doorway pec stretch (low/mid/high)   Other Shoulder Stretches L shoulder ER stretch with wand in supine with pillow under L elbow x 15 reps     Modalities   Modalities Moist Heat     Moist Heat Therapy   Number Minutes Moist Heat 5 Minutes  only 5 minutes due to schedule conflict   Moist Heat Location Shoulder  L             PT Short Term Goals - 06/22/16 0900      PT SHORT TERM GOAL #1   Title Independent with initial HEP by 06/08/16   Status On-going     PT SHORT TERM GOAL #2   Title Pt will demonstrate improved awareness of neutral shoulder posture by 06/08/16   Status On-going  PT Long Term Goals - 06/15/16 0850      PT LONG TERM GOAL #1   Title Independent with advanced HEP/gym program by 07/20/16   Status On-going     PT LONG TERM GOAL #2   Title L shoulder ROM WFL w/o limitation d/t pain by 07/20/16   Status On-going     PT LONG TERM GOAL #3   Title L shoulder strength >/= 4/5 for improved function by 07/20/16   Status On-going     PT LONG TERM GOAL #4   Title Pt will report no sleep disturbance due to L shoulder pain by 07/20/16   Status On-going     PT LONG TERM GOAL #5   Title Pt will return to 75% normal performance of ADL's, household chores and job tasks w/o limitation due to L shoulder pain by 07/20/16   Status On-going               Plan - 06/22/16 0900    Clinical Impression Statement Pt. tolerated focus of today's visit of anterior chest stretching, and L shoulder ROM activities well with pain decrease following  therex. Rationale behind proper sitting posture with job, and anterior chest flexibility discussed in length with pt. today with pt. verbalizing understanding.  Pt. will continue to benefit from postural related instruction, scapular strengthening, and gentle RTC strengthening to decrease pain and improve functional ROM.   PT Treatment/Interventions Patient/family education;Neuromuscular re-education;Therapeutic exercise;Manual techniques;Passive range of motion;Taping;Therapeutic activities;Electrical Stimulation;Moist Heat;Cryotherapy;Iontophoresis 4mg /ml Dexamethasone;ADLs/Self Care Home Management   PT Next Visit Plan Postural training; Scapular stabilization; L shoulder ROM & strengthening; Manual therapy & modalities PRN      Patient will benefit from skilled therapeutic intervention in order to improve the following deficits and impairments:  Pain, Decreased range of motion, Postural dysfunction, Decreased strength, Increased muscle spasms, Impaired UE functional use, Decreased activity tolerance  Visit Diagnosis: Left shoulder pain, unspecified chronicity  Stiffness of left shoulder, not elsewhere classified  Muscle weakness (generalized)  Abnormal posture     Problem List Patient Active Problem List   Diagnosis Date Noted  . Right shoulder pain 05/30/2016  . Left shoulder pain 01/28/2015  . Preop examination 01/28/2015  . Arthritis of left acromioclavicular joint 11/11/2014  . Biceps rupture, proximal 11/11/2014  . Complete tear of left rotator cuff 11/11/2014  . Shoulder impingement 11/11/2014    Kermit BaloMicah Josiyah Tozzi, PTA 06/22/16 11:23 AM  [facility] [place] [address]  [address] [place], [place], [address] Phone: [phone]   Fax:  [phone]  Name: Brandon Hodge MRN: [id number] Date of Birth: 06/14/1966

## 2016-06-25 ENCOUNTER — Ambulatory Visit: Payer: 59 | Admitting: Physical Therapy

## 2016-06-25 ENCOUNTER — Encounter: Payer: Self-pay | Admitting: Family Medicine

## 2016-06-25 ENCOUNTER — Ambulatory Visit (INDEPENDENT_AMBULATORY_CARE_PROVIDER_SITE_OTHER): Payer: 59 | Admitting: Family Medicine

## 2016-06-25 DIAGNOSIS — M25511 Pain in right shoulder: Secondary | ICD-10-CM | POA: Diagnosis not present

## 2016-06-25 MED ORDER — DICLOFENAC SODIUM 75 MG PO TBEC: 75.0000 mg | DELAYED_RELEASE_TABLET | Freq: Two times a day (BID) | ORAL | 1 refills | Status: DC

## 2016-06-25 NOTE — Patient Instructions (Signed)
We will go ahead with an MRI of this right shoulder since it's not improving with the shot, medicines, home exercises. Stop the meloxicam and take diclofenac twice a day with food instead. Ok to take the robaxin in addition to this.

## 2016-06-27 NOTE — Assessment & Plan Note (Signed)
Not improving as expected with meloxicam, robaxin, home exercises, s/p cortisone injection in treating for rotator cuff impingement.  We discussed options - will go ahead with MRI.  Switch to diclofenac.  Can continue robaxin as well.
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
## 2016-06-28 NOTE — Addendum Note (Signed)
Addended by: Kathi SimpersWISE, Sapir Lavey F on: 06/28/2016 03:37 PM   Modules accepted: Orders

## 2016-06-28 NOTE — Addendum Note (Signed)
Addended by: Kathi SimpersWISE, Delva Derden F on: 06/28/2016 04:22 PM   Modules accepted: Orders

## 2016-06-29 ENCOUNTER — Ambulatory Visit: Payer: 59 | Admitting: Physical Therapy

## 2016-07-02 ENCOUNTER — Telehealth: Payer: Self-pay | Admitting: Family Medicine

## 2016-07-02 ENCOUNTER — Ambulatory Visit: Payer: 59 | Admitting: Physical Therapy

## 2016-07-02 DIAGNOSIS — M25612 Stiffness of left shoulder, not elsewhere classified: Secondary | ICD-10-CM

## 2016-07-02 DIAGNOSIS — M25512 Pain in left shoulder: Secondary | ICD-10-CM | POA: Diagnosis not present

## 2016-07-02 DIAGNOSIS — R293 Abnormal posture: Secondary | ICD-10-CM

## 2016-07-02 DIAGNOSIS — M6281 Muscle weakness (generalized): Secondary | ICD-10-CM

## 2016-07-02 NOTE — Therapy (Signed)
[facility] [place] [address]  [address] [place], [place], [address] Phone: [phone]   Fax:  [phone]  Physical Therapy Treatment  Patient Details  Name: Brandon MandesDouglas Hodge MRN: [id number] Date of Birth: 06/26/1966 Referring Provider: Norton BlizzardShane Hudnall, MD  Encounter Date: 07/02/2016      PT End of Session - 07/02/16 0801    Visit Number 5   Number of Visits 12   Date for PT Re-Evaluation 07/20/16   Authorization - Number of Visits 60   PT Start Time 0800   PT Stop Time 0840   PT Time Calculation (min) 40 min   Activity Tolerance Patient tolerated treatment well   Behavior During Therapy [facility] for tasks assessed/performed      Past Medical History:  Diagnosis Date  . Hypercholesteremia   . Rotator cuff (capsule) sprain 2013    Past Surgical History:  Procedure Laterality Date  . HAND SURGERY Left 2012    There were no vitals filed for this visit.      Subjective Assessment - 07/02/16 0801    Subjective Patient reporitng L shoulder "discomfort/aching," however, R shoulder has been starting to give patient issues. Saw MD, has gotten injection in R shoulder, awaiting scheduling for MRI.    Diagnostic tests 05/05/16 MRI shoulder:  Large full-thickness tear of the supraspinatus. Bulky acromioclavicular osteoarthritis and subacromial spurring. Tendinopathy of the intra-articular long head of biceps without tear. Mild fatty atrophy of all imaged musculature compatible with disuse.   Patient Stated Goals "Feel better & get back to working out as much as possible"   Currently in Pain? Yes   Pain Score 4    Pain Location Shoulder  R shoulder: 7/10   Pain Orientation Left   Pain Descriptors / Indicators Dull  R tricep region : stabbing   Pain Onset More than a month ago   Pain Frequency Constant   Aggravating Factors  sleeping/lying down   Pain Relieving Factors heat (hot towel), pain meds, muscle relaxants                          [facility] Adult PT Treatment/Exercise - 07/02/16 0805      Shoulder Exercises: Standing   External Rotation Left;15 reps;Theraband  2 sets   Theraband Level (Shoulder External Rotation) Level 3 (Green)   External Rotation Limitations smaller range of movement   Internal Rotation Left;15 reps;Theraband  2 sets   Theraband Level (Shoulder Internal Rotation) Level 3 (Green)   Flexion AAROM;Left;15 reps  wall slides   Extension Both;15 reps;Theraband   Theraband Level (Shoulder Extension) Level 3 (Green)   Row Both;15 reps;Theraband   Theraband Level (Shoulder Row) Level 3 (Green)     Shoulder Exercises: ROM/Strengthening   UBE (Upper Arm Bike) level 4.5 x 6' (fwd 3'/ bwd 3')   Cybex Row --  25# x 15 each hand hold     Manual Therapy   Manual Therapy Passive ROM   Passive ROM PROM with overpressure into ER, IR, and abduction with no pain. Pt reporting feeling "better and looser" following manual stretching                  PT Short Term Goals - 06/22/16 0900      PT SHORT TERM GOAL #1   Title Independent with initial HEP by 06/08/16   Status On-going     PT SHORT TERM GOAL #2   Title Pt will  demonstrate improved awareness of neutral shoulder posture by 06/08/16   Status On-going           PT Long Term Goals - 06/15/16 0850      PT LONG TERM GOAL #1   Title Independent with advanced HEP/gym program by 07/20/16   Status On-going     PT LONG TERM GOAL #2   Title L shoulder ROM WFL w/o limitation d/t pain by 07/20/16   Status On-going     PT LONG TERM GOAL #3   Title L shoulder strength >/= 4/5 for improved function by 07/20/16   Status On-going     PT LONG TERM GOAL #4   Title Pt will report no sleep disturbance due to L shoulder pain by 07/20/16   Status On-going     PT LONG TERM GOAL #5   Title Pt will return to 75% normal performance of ADL's, household chores and job tasks w/o limitation due to L shoulder pain by  07/20/16   Status On-going               Plan - 07/02/16 0845    Clinical Impression Statement Patient today with subjective reports of R shoulder pain which is being evaluated by Dr. Pearletha ForgeHudnall. Patient Today progressing L shoulder IR/ER strengthening with increase in theraband resistance with no increase in L shoulder pain following treatment. Manual PROM performed by PT with slight overpressure with patinet responding well with reduced tightness and discomfort following treatment session.    PT Treatment/Interventions Patient/family education;Neuromuscular re-education;Therapeutic exercise;Manual techniques;Passive range of motion;Taping;Therapeutic activities;Electrical Stimulation;Moist Heat;Cryotherapy;Iontophoresis 4mg /ml Dexamethasone;ADLs/Self Care Home Management   PT Next Visit Plan Postural training; Scapular stabilization; L shoulder ROM & strengthening; Manual therapy & modalities PRN   Consulted and Agree with Plan of Care Patient      Patient will benefit from skilled therapeutic intervention in order to improve the following deficits and impairments:  Pain, Decreased range of motion, Postural dysfunction, Decreased strength, Increased muscle spasms, Impaired UE functional use, Decreased activity tolerance  Visit Diagnosis: Left shoulder pain, unspecified chronicity  Stiffness of left shoulder, not elsewhere classified  Muscle weakness (generalized)  Abnormal posture     Problem List Patient Active Problem List   Diagnosis Date Noted  . Right shoulder pain 05/30/2016  . Left shoulder pain 01/28/2015  . Preop examination 01/28/2015  . Arthritis of left acromioclavicular joint 11/11/2014  . Biceps rupture, proximal 11/11/2014  . Complete tear of left rotator cuff 11/11/2014  . Shoulder impingement 11/11/2014      [name], PT, DPT 07/02/16 9:38 AM     [facility] [place] [address]  [address] [place], [place], [address] Phone: [phone]   Fax:  [phone]  Name: Brandon MandesDouglas Cartlidge MRN: [id number] Date of Birth: 10/08/1965

## 2016-07-03 NOTE — Telephone Encounter (Signed)
Spoke to patient and told him that he needs to come by and get x-ray of shoulder. Brandon SpanielSaid he would come by on Monday to get x-ray.

## 2016-07-09 ENCOUNTER — Ambulatory Visit (HOSPITAL_BASED_OUTPATIENT_CLINIC_OR_DEPARTMENT_OTHER)
Admission: RE | Admit: 2016-07-09 | Discharge: 2016-07-09 | Disposition: A | Discharge status: Home / Self Care | Payer: 59 | Source: Ambulatory Visit | Attending: Family Medicine | Admitting: Family Medicine

## 2016-07-09 ENCOUNTER — Ambulatory Visit: Payer: 59 | Admitting: Physical Therapy

## 2016-07-09 DIAGNOSIS — M25511 Pain in right shoulder: Secondary | ICD-10-CM | POA: Diagnosis present

## 2016-07-09 DIAGNOSIS — M6281 Muscle weakness (generalized): Secondary | ICD-10-CM

## 2016-07-09 DIAGNOSIS — M25711 Osteophyte, right shoulder: Secondary | ICD-10-CM | POA: Insufficient documentation

## 2016-07-09 DIAGNOSIS — M25512 Pain in left shoulder: Secondary | ICD-10-CM

## 2016-07-09 DIAGNOSIS — M25612 Stiffness of left shoulder, not elsewhere classified: Secondary | ICD-10-CM

## 2016-07-09 DIAGNOSIS — R293 Abnormal posture: Secondary | ICD-10-CM

## 2016-07-09 NOTE — Therapy (Addendum)
[facility] [address]  [place], [place], [address] Phone: [phone]   Fax:  [phone]  Physical Therapy Treatment  Patient Details  Name: Brandon Hodge MRN: [id number] Date of Birth: [birth year] Referring Provider: Karlton Lemon, MD  Encounter Date: 07/09/2016      PT End of Session - 07/09/16 0809    Visit Number 6   Number of Visits 12   Date for PT Re-Evaluation 07/20/16   Authorization - Number of Visits 60   PT Start Time 0803   PT Stop Time 0845   PT Time Calculation (min) 42 min   Activity Tolerance Patient tolerated treatment well   Behavior During Therapy [facility] for tasks assessed/performed      Past Medical History:  Diagnosis Date  . Hypercholesteremia   . Rotator cuff (capsule) sprain 2013    Past Surgical History:  Procedure Laterality Date  . HAND SURGERY Left 2012    There were no vitals filed for this visit.      Subjective Assessment - 07/09/16 0803    Subjective Pt reports "always has pain" but not to bad today.   Diagnostic tests 05/05/16 MRI shoulder:  Large full-thickness tear of the supraspinatus. Bulky acromioclavicular osteoarthritis and subacromial spurring. Tendinopathy of the intra-articular long head of biceps without tear. Mild fatty atrophy of all imaged musculature compatible with disuse.   Patient Stated Goals "Feel better & get back to working out as much as possible"   Currently in Pain? Yes   Pain Score 3    Pain Location Shoulder   Pain Orientation Left   Pain Onset More than a month ago            [facility] PT Assessment - 07/09/16 0803      AROM   AROM Assessment Site Shoulder   Right Shoulder Flexion 154 Degrees   Right Shoulder ABduction 139 Degrees   Left Shoulder Flexion 66 Degrees  sitting   Left Shoulder ABduction 60 Degrees  sitting   Left Shoulder Internal Rotation 73 Degrees  supine   Left Shoulder External Rotation 74 Degrees  supine     PROM   Overall PROM Comments all measurements in supine   PROM Assessment Site Shoulder   Left Shoulder Flexion 158 Degrees   Left Shoulder ABduction 154 Degrees   Left Shoulder Internal Rotation 82 Degrees   Left Shoulder External Rotation 79 Degrees     Strength   Strength Assessment Site Shoulder   Right Shoulder Flexion 4+/5   Right Shoulder ABduction 5/5   Right Shoulder Internal Rotation 5/5   Right Shoulder External Rotation 3+/5   Left Shoulder Flexion 2+/5   Left Shoulder ABduction 2+/5   Left Shoulder Internal Rotation 4-/5   Left Shoulder External Rotation 3+/5                     [facility] Adult PT Treatment/Exercise - 07/09/16 0803      Shoulder Exercises: Supine   Protraction Left;10 reps;Weights  2 sets   Protraction Weight (lbs) 4#   Flexion Left;10 reps;Weights  2 sets,   Shoulder Flexion Weight (lbs) 1# x10, 2# x10   Other Supine Exercises L shoulder circles at 90 dg flexion CW/CCW 2# x10 each     Shoulder Exercises: Prone   Retraction Left;10 reps;Weights   Retraction Weight (lbs) 2#   Retraction Limitations Row   Extension Left;10 reps;Weights  2 sets   Extension Weight (  lbs) 1# on 2nd set   Extension Limitations I's   Horizontal ABduction 1 Left;10 reps   Horizontal ABduction 1 Limitations limited control     Shoulder Exercises: Sidelying   External Rotation Left;10 reps;Weights  2 sets   External Rotation Weight (lbs) 1#   External Rotation Limitations 1# weight on 2nd set, able to raise ~30 dg past neutral    ABduction Left;10 reps;Weights  2 sets   ABduction Weight (lbs) 1# weight on 2nd set   Other Sidelying Exercises L shoulder circles at 90 dg ABD CW/CCW 1# x10 each     Shoulder Exercises: ROM/Strengthening   UBE (Upper Arm Bike) level 4.0 x 6' (fwd 3'/ bwd 3')     Shoulder Exercises: IT sales professional 30 seconds   Corner Stretch Limitations 3 way doorway pec stretch (low/mid/high)                  PT  Short Term Goals - 07/09/16 0810      PT SHORT TERM GOAL #1   Title Independent with initial HEP by 06/08/16   Status Achieved     PT SHORT TERM GOAL #2   Title Pt will demonstrate improved awareness of neutral shoulder posture by 06/08/16   Status Achieved           PT Long Term Goals - 07/09/16 0810      PT LONG TERM GOAL #1   Title Independent with advanced HEP/gym program by 07/20/16   Status On-going     PT LONG TERM GOAL #2   Title L shoulder ROM WFL w/o limitation d/t pain by 07/20/16   Status On-going     PT LONG TERM GOAL #3   Title L shoulder strength >/= 4/5 for improved function by 07/20/16   Status On-going     PT LONG TERM GOAL #4   Title Pt will report no sleep disturbance due to L shoulder pain by 07/20/16   Status On-going     PT LONG TERM GOAL #5   Title Pt will return to 75% normal performance of ADL's, household chores and job tasks w/o limitation due to L shoulder pain by 07/20/16   Status On-going               Plan - 07/09/16 0811    Clinical Impression Statement Pt reporting continued improvement in L sholuder pain but continues to demonstrate significant weakness against gravity with AROM flexion and abduction <90 dg against gravity, but nearly full AROM available in gravity minimized position. Continued emphasis on scapular and shoulder strengthening with pt reporting shoulder felt better by end of session.   PT Treatment/Interventions Patient/family education;Neuromuscular re-education;Therapeutic exercise;Manual techniques;Passive range of motion;Taping;Therapeutic activities;Electrical Stimulation;Moist Heat;Cryotherapy;Iontophoresis 28m/ml Dexamethasone;ADLs/Self Care Home Management   PT Next Visit Plan Postural training; Scapular stabilization; L shoulder ROM & strengthening; Manual therapy & modalities PRN   Consulted and Agree with Plan of Care Patient      Patient will benefit from skilled therapeutic intervention in order to improve  the following deficits and impairments:  Pain, Decreased range of motion, Postural dysfunction, Decreased strength, Increased muscle spasms, Impaired UE functional use, Decreased activity tolerance  Visit Diagnosis: Left shoulder pain, unspecified chronicity  Stiffness of left shoulder, not elsewhere classified  Muscle weakness (generalized)  Abnormal posture     Problem List Patient Active Problem List   Diagnosis Date Noted  . Right shoulder pain 05/30/2016  . Left shoulder pain 01/28/2015  .  Preop examination 01/28/2015  . Arthritis of left acromioclavicular joint 11/11/2014  . Biceps rupture, proximal 11/11/2014  . Complete tear of left rotator cuff 11/11/2014  . Shoulder impingement 11/11/2014    Percival Spanish, PT, MPT 07/09/2016, 8:54 AM  [facility] [address]  [place], [place], [address] Phone: [phone]   Fax:  [phone]  Name: Brandon Hodge MRN: [id number] Date of Birth: 1966-05-13   PHYSICAL THERAPY DISCHARGE SUMMARY  Visits from Start of Care: 6  Current functional level related to goals / functional outcomes:   Unable to assess as pt has had >3 no shows since last seen >30 days ago on 07/09/16. Will proceed with discharge from PT per no show policy.   Remaining deficits:    As above.   Education / Equipment:    HEP  Plan: Patient agrees to discharge.  Patient goals were partially met. Patient is being discharged due to not returning since the last visit.  ?????    Percival Spanish, PT, MPT 08/14/16, 10:43 AM  [facility] [address]  [place], [place], [address] Phone: [phone]   Fax:  [phone]

## 2016-07-13 ENCOUNTER — Ambulatory Visit: Payer: 59 | Attending: Family Medicine

## 2016-07-14 ENCOUNTER — Ambulatory Visit (HOSPITAL_BASED_OUTPATIENT_CLINIC_OR_DEPARTMENT_OTHER): Payer: 59

## 2016-07-16 ENCOUNTER — Ambulatory Visit: Payer: 59 | Admitting: Physical Therapy

## 2016-07-20 ENCOUNTER — Ambulatory Visit: Payer: 59 | Admitting: Physical Therapy

## 2016-07-21 ENCOUNTER — Ambulatory Visit (HOSPITAL_BASED_OUTPATIENT_CLINIC_OR_DEPARTMENT_OTHER): Admission: RE | Admit: 2016-07-21 | Payer: 59 | Source: Ambulatory Visit

## 2016-07-23 ENCOUNTER — Ambulatory Visit: Payer: 59 | Admitting: Physical Therapy

## 2016-07-27 ENCOUNTER — Ambulatory Visit: Payer: 59 | Admitting: Physical Therapy

## 2016-07-28 ENCOUNTER — Ambulatory Visit (HOSPITAL_BASED_OUTPATIENT_CLINIC_OR_DEPARTMENT_OTHER)
Admission: RE | Admit: 2016-07-28 | Discharge: 2016-07-28 | Disposition: A | Discharge status: Home / Self Care | Payer: 59 | Source: Ambulatory Visit | Attending: Family Medicine | Admitting: Family Medicine

## 2016-07-28 DIAGNOSIS — M62511 Muscle wasting and atrophy, not elsewhere classified, right shoulder: Secondary | ICD-10-CM | POA: Insufficient documentation

## 2016-07-28 DIAGNOSIS — M84411A Pathological fracture, right shoulder, initial encounter for fracture: Secondary | ICD-10-CM | POA: Diagnosis not present

## 2016-07-28 DIAGNOSIS — M25511 Pain in right shoulder: Secondary | ICD-10-CM | POA: Insufficient documentation

## 2016-07-28 DIAGNOSIS — M75121 Complete rotator cuff tear or rupture of right shoulder, not specified as traumatic: Secondary | ICD-10-CM | POA: Insufficient documentation

## 2016-07-30 ENCOUNTER — Ambulatory Visit: Payer: 59 | Admitting: Physical Therapy

## 2016-08-02 ENCOUNTER — Telehealth: Payer: Self-pay | Admitting: Family Medicine

## 2016-08-14 NOTE — Telephone Encounter (Signed)
Spoke to patient and gave him appointment date and time.

## 2016-08-20 ENCOUNTER — Telehealth: Payer: Self-pay | Admitting: Family Medicine

## 2016-08-20 NOTE — Addendum Note (Signed)
Addended by: Kathi SimpersWISE, Alter Moss F on: 08/20/2016 09:02 AM   Modules accepted: Orders

## 2016-08-20 NOTE — Telephone Encounter (Signed)
Spoke to patient and gave him information about appointment today. Patient also stated that he discussed with you about his left shoulder and disability. Patient would like to talk with you.

## 2016-08-22 NOTE — Telephone Encounter (Signed)
Spoke with patient about his shoulder impairment.  He is likely going to go ahead with applying for disability - advised to request records from us and we can fill out other forms they have if necessary.

## 2017-01-04 ENCOUNTER — Encounter: Payer: Self-pay | Admitting: Family Medicine

## 2017-01-04 ENCOUNTER — Ambulatory Visit (INDEPENDENT_AMBULATORY_CARE_PROVIDER_SITE_OTHER): Payer: 59 | Admitting: Family Medicine

## 2017-01-04 DIAGNOSIS — M25512 Pain in left shoulder: Secondary | ICD-10-CM

## 2017-01-04 MED ORDER — NAPROXEN 500 MG PO TABS: 500.0000 mg | ORAL_TABLET | Freq: Two times a day (BID) | ORAL | 2 refills | Status: DC | PRN

## 2017-01-04 MED ORDER — HYDROCODONE-ACETAMINOPHEN 5-325 MG PO TABS: 1.0000 | ORAL_TABLET | ORAL | 0 refills | Status: DC | PRN

## 2017-01-04 NOTE — Patient Instructions (Signed)
Follow up with the orthopedic surgeon in about a month if you're considering surgery in July. Take naproxen 500mg  twice a day with food for pain and inflammation. Norco as needed for severe pain (no driving on this - use sparingly). Consider capsaicin, biofreeze, salon pas topically up to 4 times a day for pain. Heat 15 minutes at a time 3-4 times a day. Follow up with me as needed.

## 2017-01-07 NOTE — Assessment & Plan Note (Signed)
Known retracted supraspinatus tear.  He has seen ortho about this though I cannot access their notes.  He reports they wanted to do surgery and tentatively plan to do so in July - we discussed it's more likely a decompression than repair given amount of retraction, some fatty atrophy.  Has done PT, home exercises, meloxicam, robaxin, diclofenac.  Advised against injection.  He will try naproxen with norco as needed, topical medications.  Heat as needed.  Advised to f/u with ortho regarding possible decompression, debridement of tear.  F/u prn with us.
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
## 2017-02-05 IMAGING — MR MR SHOULDER*L* W/O CM
5 series · 40 of 40 positions shown · non-contrast
Comparison: Plain films left shoulder 04/25/2016. MRI left shoulder
09/28/2014.

CLINICAL DATA: Diffuse left shoulder pain, popping and limited
range of motion for 4 years. No known injury.

EXAM:
MRI OF THE LEFT SHOULDER WITHOUT CONTRAST
TECHNIQUE: Multiplanar, multisequence MR imaging of the shoulder was performed.
No intravenous contrast was administered.

[Series 5: T2 fat-sat · axial · 4.0mm · 0.55mm/px · z∈[-58,+47]mm · 10 of 22 slices shown (1 of 3)]
[im 1/22]
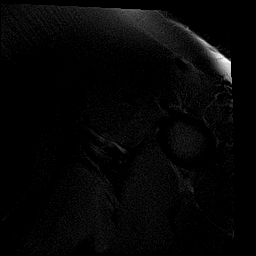
[im 3/22]
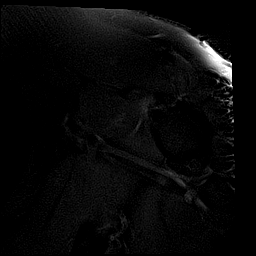
[im 5/22]
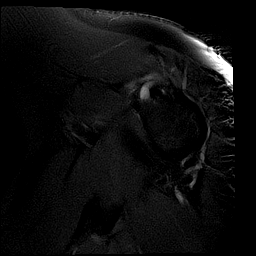
[im 8/22]
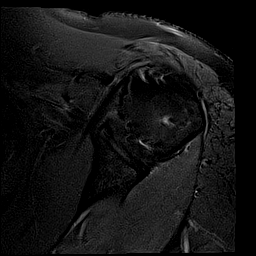
[im 10/22]
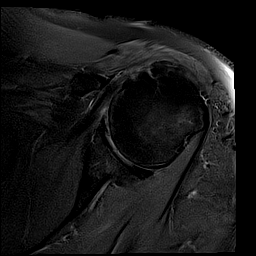
[im 12/22]
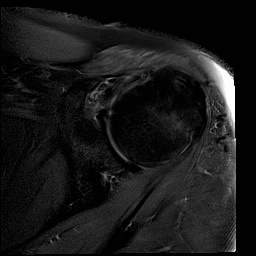
[im 15/22]
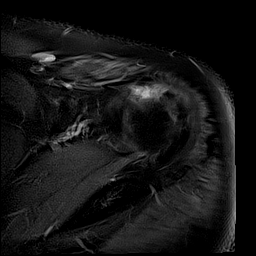
[im 17/22]
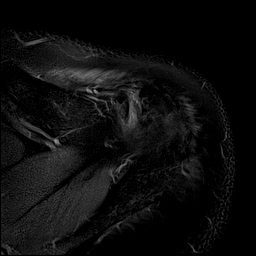
[im 19/22]
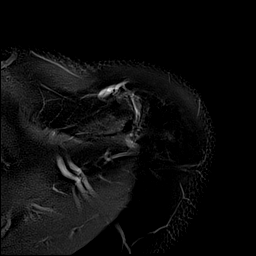
[im 22/22]
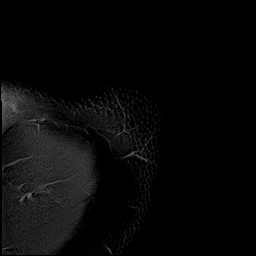

[Series 6: T2 fat-sat · oblique · 4.0mm · 0.55mm/px · 7 of 17 slices shown (2 of 3)]
[im 1/17]
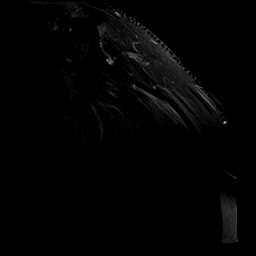
[im 3/17]
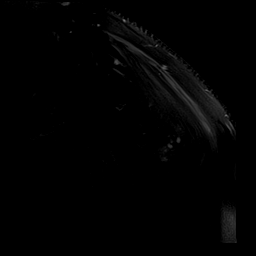
[im 6/17]
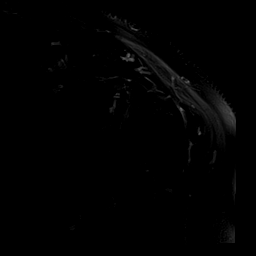
[im 9/17]
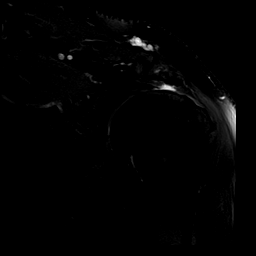
[im 11/17]
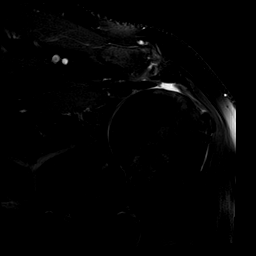
[im 14/17]
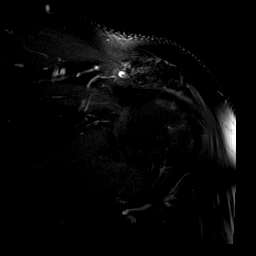
[im 17/17]
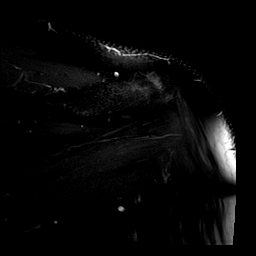

[Series 7: PD fat-sat · oblique · 4.0mm · 0.55mm/px · 7 of 17 slices shown]
[im 1/17]
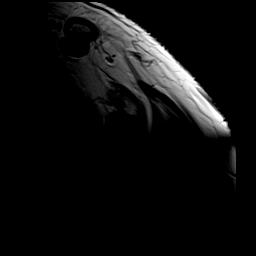
[im 3/17]
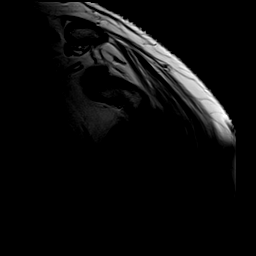
[im 6/17]
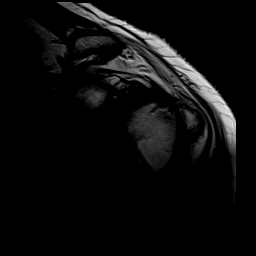
[im 9/17]
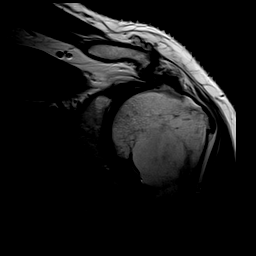
[im 11/17]
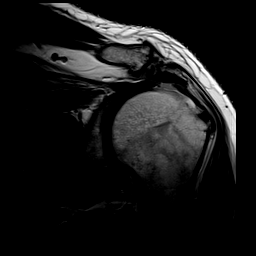
[im 14/17]
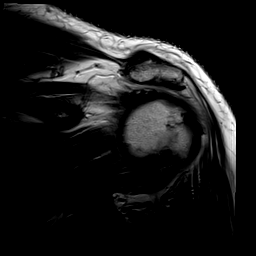
[im 17/17]
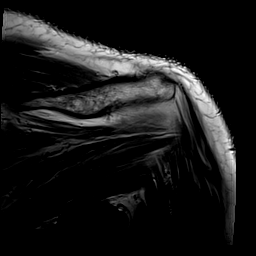

[Series 8: T2 fat-sat · oblique · 4.0mm · 0.55mm/px · 8 of 20 slices shown (3 of 3)]
[im 1/20]
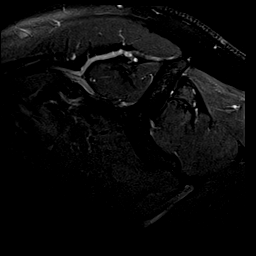
[im 3/20]
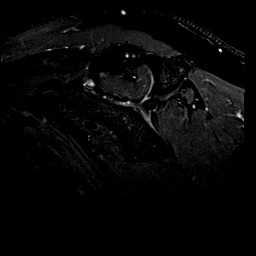
[im 6/20]
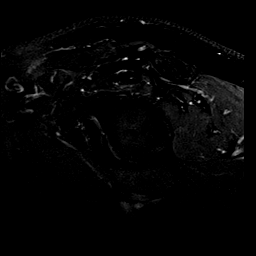
[im 9/20]
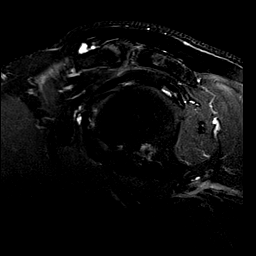
[im 11/20]
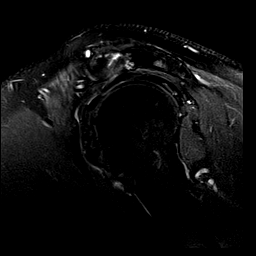
[im 14/20]
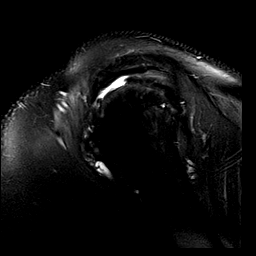
[im 17/20]
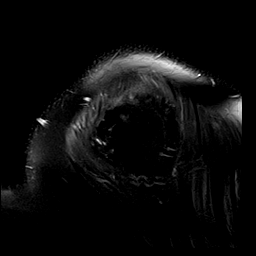
[im 20/20]
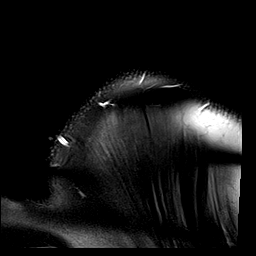

[Series 9: T1 · oblique · 4.0mm · 0.55mm/px · 8 of 20 slices shown]
[im 1/20]
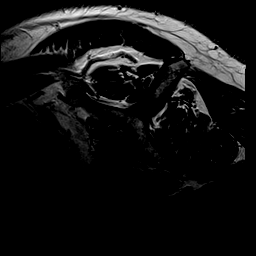
[im 3/20]
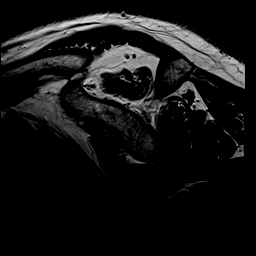
[im 6/20]
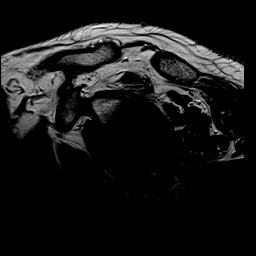
[im 9/20]
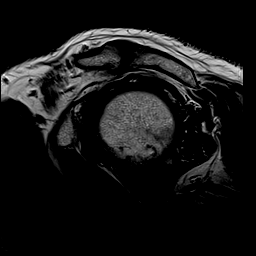
[im 11/20]
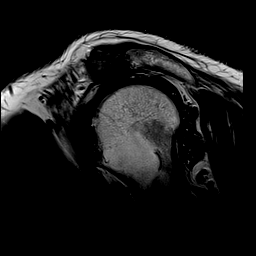
[im 14/20]
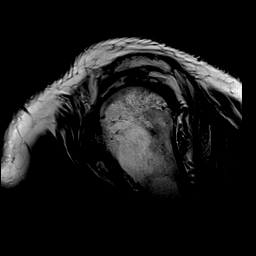
[im 17/20]
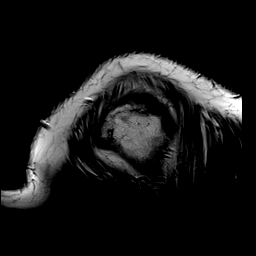
[im 20/20]
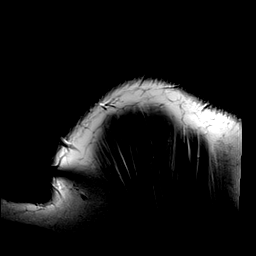

[40 of 40 positions shown; findings below may reference images not displayed]

FINDINGS: Rotator cuff: There is rotator cuff tendinopathy. Again seen is a
large full-thickness tear of the supraspinatus measuring
approximately 1.8 cm from front to back. Retraction is just medial
to the top of the humeral head, 2.5-3.5 cm.

Muscles: No focal atrophy or lesion. All imaged musculature
demonstrates some fatty replacement most compatible with disuse.

Biceps long head: Tendinopathy of the intra-articular segment
without tear is identified.

Acromioclavicular Joint: Bulky degenerative disease is not notably
changed. Type 2 acromion. Large subacromial spur is identified.

Glenohumeral Joint: Unremarkable.

Labrum:  Intact.

Bones:  No fracture or worrisome marrow lesion.

Other: None.
IMPRESSION: Large full-thickness tear of the supraspinatus measuring 1.8 cm from
front to back with retraction of 2.5-3.5 cm is seen as on the prior
study.

Bulky acromioclavicular osteoarthritis and subacromial spurring
appear unchanged.

Tendinopathy of the intra-articular long head of biceps without
tear.

Mild fatty atrophy of all imaged musculature compatible with disuse.

## 2017-03-19 ENCOUNTER — Ambulatory Visit (INDEPENDENT_AMBULATORY_CARE_PROVIDER_SITE_OTHER): Payer: 59 | Admitting: Family Medicine

## 2017-03-19 ENCOUNTER — Encounter: Payer: Self-pay | Admitting: Family Medicine

## 2017-03-19 DIAGNOSIS — M25512 Pain in left shoulder: Secondary | ICD-10-CM

## 2017-03-19 DIAGNOSIS — G8929 Other chronic pain: Secondary | ICD-10-CM

## 2017-03-19 DIAGNOSIS — M25511 Pain in right shoulder: Secondary | ICD-10-CM | POA: Diagnosis not present

## 2017-03-19 NOTE — Patient Instructions (Addendum)
Follow up with the orthopedic surgeon about your shoulders. I doubt repeating physical therapy will give you much benefit. There are several medicines you can take in combination to help with the pain: Tylenol 500mg  1-2 tabs three times a day. Aleve 2 tabs twice a day with food OR ibuprofen 600mg  three times a day with food for pain and inflammation. Consider capsaicin, biofreeze, salon pas topically up to 4 times a day for pain. Tramadol is an option but this hasn't worked for you in the past and we don't refill the hydrocodone or oxycodone. Heat 15 minutes at a time 3-4 times a day. Follow up with me as needed.

## 2017-03-21 DIAGNOSIS — M25512 Pain in left shoulder: Secondary | ICD-10-CM | POA: Insufficient documentation

## 2017-03-21 DIAGNOSIS — M25511 Pain in right shoulder: Secondary | ICD-10-CM | POA: Insufficient documentation

## 2017-03-21 NOTE — Assessment & Plan Note (Signed)
Known full thickness tears of supraspinatus and infraspinatus on right, supraspinatus on left.  He has done PT, home exercises, meloxicam, robaxin, diclofenac, naproxen, norco, topical medications.  He was referred to surgeon and ? Has surgery scheduled.  He is requesting refill on norco as this is only medicine that has helped but advised him we do not refill this and it's not a long term medication.  Encouraged to follow up with orthopedics.
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
## 2018-05-10 ENCOUNTER — Emergency Department (HOSPITAL_BASED_OUTPATIENT_CLINIC_OR_DEPARTMENT_OTHER)
Admission: EM | Admit: 2018-05-10 | Discharge: 2018-05-10 | Disposition: A | Discharge status: Home / Self Care | Payer: 59 | Attending: Emergency Medicine | Admitting: Emergency Medicine

## 2018-05-10 ENCOUNTER — Encounter (HOSPITAL_BASED_OUTPATIENT_CLINIC_OR_DEPARTMENT_OTHER): Payer: Self-pay | Admitting: Adult Health

## 2018-05-10 ENCOUNTER — Other Ambulatory Visit: Payer: Self-pay

## 2018-05-10 DIAGNOSIS — Z87891 Personal history of nicotine dependence: Secondary | ICD-10-CM | POA: Insufficient documentation

## 2018-05-10 DIAGNOSIS — M25511 Pain in right shoulder: Secondary | ICD-10-CM | POA: Insufficient documentation

## 2018-05-10 MED ORDER — HYDROCODONE-ACETAMINOPHEN 5-325 MG PO TABS: 1.0000 | ORAL_TABLET | Freq: Four times a day (QID) | ORAL | 0 refills | Status: AC | PRN

## 2018-05-10 MED ORDER — HYDROCODONE-ACETAMINOPHEN 5-325 MG PO TABS
1.0000 | ORAL_TABLET | Freq: Once | ORAL | Status: AC
  Administered 2018-05-10: 1 via ORAL
  Filled 2018-05-10: qty 1

## 2018-05-10 MED ORDER — MELOXICAM 7.5 MG PO TABS: 7.5000 mg | ORAL_TABLET | Freq: Every day | ORAL | 0 refills | Status: DC

## 2018-05-10 NOTE — ED Notes (Signed)
Pt reports chronic should pain bilaterally from old sports injuries. Pt reports he has a torn rotator cuff I his left shoulder that they cannot do anything about but he is not sure what is going on with his right shoulder. Pt reports his pain in his right shoulder started 9 days ago with nothing precipitating same.

## 2018-05-10 NOTE — ED Triage Notes (Signed)
PResents with right shoulder pain for over one week. He reports a history of shoulder problems and shoulder pain. The pain is worse when arm is raised above head.

## 2018-05-10 NOTE — ED Notes (Signed)
Patient verbalizes understanding of discharge instructions. Opportunity for questioning and answers were provided. Armband removed by staff, pt discharged from ED to his home POV.

## 2018-05-11 NOTE — ED Provider Notes (Signed)
[facility] EMERGENCY DEPARTMENT Provider Note   CSN: [id number] Arrival date & time: 05/10/18  2030     History   Chief Complaint Chief Complaint  Patient presents with  . Shoulder Injury    HPI Brandon Hodge is a 52 y.o. male.  The history is provided by the patient.  Shoulder Injury  This is a new problem. The current episode started more than 1 week ago. The problem occurs daily. The problem has been gradually worsening. Pertinent negatives include no chest pain. Exacerbated by: Movement. The symptoms are relieved by rest. Treatments tried: OTC meds. The treatment provided no relief.  patient reports for the past 9 days has had right shoulder pain.  No trauma.  Hurts to move his arm.  No chest pain shortness breath.  No neck pain.  No weakness in his arm.  He usually has pain in his left shoulder, but this pain in his right shoulder is new.  Past Medical History:  Diagnosis Date  . Hypercholesteremia   . Rotator cuff (capsule) sprain 2013    Patient Active Problem List   Diagnosis Date Noted  . Bilateral shoulder pain 03/21/2017  . Right shoulder pain 05/30/2016  . Left shoulder pain 01/28/2015  . Preop examination 01/28/2015  . Arthritis of left acromioclavicular joint 11/11/2014  . Biceps rupture, proximal 11/11/2014  . Complete tear of left rotator cuff 11/11/2014  . Shoulder impingement 11/11/2014    Past Surgical History:  Procedure Laterality Date  . HAND SURGERY Left 2012        Home Medications    Prior to Admission medications   Medication Sig Start Date End Date Taking? Authorizing Provider  HYDROcodone-acetaminophen (NORCO/VICODIN) 5-325 MG tablet Take 1 tablet by mouth every 6 (six) hours as needed for severe pain. 05/10/18   Zadie Rhine, MD  meloxicam (MOBIC) 7.5 MG tablet Take 1 tablet (7.5 mg total) by mouth daily. 05/10/18   Zadie Rhine, MD    Family History History reviewed. No pertinent family history.  Social  History Social History   Tobacco Use  . Smoking status: Former Games developer  . Smokeless tobacco: Never Used  Substance Use Topics  . Alcohol use: Yes    Comment: several times per week  . Drug use: Yes    Types: Marijuana     Allergies   Patient has no known allergies.   Review of Systems Review of Systems  Constitutional: Negative for fever.  Cardiovascular: Negative for chest pain.  Musculoskeletal: Positive for arthralgias. Negative for neck pain.  All other systems reviewed and are negative.    Physical Exam Updated Vital Signs BP 127/75   Pulse 79   Temp 98.3 F (36.8 C) (Oral)   Resp 18   Ht 1.803 m (5\' 11" )   Wt 102.1 kg   SpO2 100%   BMI 31.38 kg/m   Physical Exam  CONSTITUTIONAL: Well developed/well nourished HEAD: Normocephalic/atraumatic EYES: EOMI/PERRL ENMT: Mucous membranes moist NECK: supple no meningeal signs SPINE/BACK:entire spine nontender CV: S1/S2 noted, no murmurs/rubs/gallops noted LUNGS: Lungs are clear to auscultation bilaterally, no apparent distress ABDOMEN: soft NEURO: Pt is awake/alert/appropriate, moves all extremitiesx4.  No facial droop.   EXTREMITIES: pulses normal/equal, full ROM, there is tenderness to palpation of right shoulder, no deformities, no erythema, he is able to abduct the right shoulder above his head.  Pulses intact. No other focal tenderness to his extremities SKIN: warm, color normal PSYCH: no abnormalities of mood noted, alert and oriented to situation  ED Treatments / Results  Labs (all labs ordered are listed, but only abnormal results are displayed) Labs Reviewed - No data to display  EKG None  Radiology No results found.  Procedures Procedures (including critical care time)  Medications Ordered in ED Medications  HYDROcodone-acetaminophen (NORCO/VICODIN) 5-325 MG per tablet 1 tablet (1 tablet Oral Given 05/10/18 2338)     Initial Impression / Assessment and Plan / ED Course  I have reviewed  the triage vital signs and the nursing notes.  Narcotic database reviewed and considered in decision making     Per previous imaging, patient has significant injuries to both shoulders.  He was unaware of the significant tears of infraspinatus/supraspinatus tendons  in his right shoulder No signs of infectious or vascular etiology No deformities Referred to sports med/ortho He reports he is supposed to have surgery in 2020 Final Clinical Impressions(s) / ED Diagnoses   Final diagnoses:  Acute pain of right shoulder    ED Discharge Orders         Ordered    meloxicam (MOBIC) 7.5 MG tablet  Daily     05/10/18 2340    HYDROcodone-acetaminophen (NORCO/VICODIN) 5-325 MG tablet  Every 6 hours PRN     05/10/18 2340           Zadie Rhine, MD 05/11/18 [phone]

## 2018-05-16 ENCOUNTER — Ambulatory Visit: Payer: 59 | Admitting: Family Medicine

## 2018-05-19 ENCOUNTER — Ambulatory Visit (INDEPENDENT_AMBULATORY_CARE_PROVIDER_SITE_OTHER): Payer: 59 | Admitting: Family Medicine

## 2018-05-19 ENCOUNTER — Encounter: Payer: Self-pay | Admitting: Family Medicine

## 2018-05-19 VITALS — BP 109/66 | HR 75 | Ht 71.0 in | Wt 245.0 lb

## 2018-05-19 DIAGNOSIS — M25511 Pain in right shoulder: Secondary | ICD-10-CM | POA: Diagnosis not present

## 2018-05-19 MED ORDER — DICLOFENAC SODIUM 75 MG PO TBEC: 75.0000 mg | DELAYED_RELEASE_TABLET | Freq: Two times a day (BID) | ORAL | 1 refills | Status: AC

## 2018-05-19 NOTE — Progress Notes (Signed)
PCP: Coralee Rud, PA-C  Subjective:   HPI: Patient is a 52 y.o. male here for right shoulder pain for the past few months.  Patient today reports 2/10 pain over the top of his right shoulder.  It can be as bad as 7/10.  He denies any recent injury to the shoulder.  No significant issues with range of motion.  His pain waxes and wanes.  Is worse on certain movements or with changes in weather.  He denies any localized erythema or bruising.  He does report feeling a lump over the top of the shoulder.  He denies any numbness or tingling.  No other skin changes.   Past Medical History:  Diagnosis Date  . Hypercholesteremia   . Rotator cuff (capsule) sprain 2013    Current Outpatient Medications on File Prior to Visit  Medication Sig Dispense Refill  . HYDROcodone-acetaminophen (NORCO/VICODIN) 5-325 MG tablet Take 1 tablet by mouth every 6 (six) hours as needed for severe pain. 5 tablet 0  . meloxicam (MOBIC) 7.5 MG tablet Take 1 tablet (7.5 mg total) by mouth daily. 14 tablet 0   No current facility-administered medications on file prior to visit.     Past Surgical History:  Procedure Laterality Date  . HAND SURGERY Left 2012    No Known Allergies  Social History   Socioeconomic History  . Marital status: Married    Spouse name: Not on file  . Number of children: Not on file  . Years of education: Not on file  . Highest education level: Not on file  Occupational History  . Not on file  Social Needs  . Financial resource strain: Not on file  . Food insecurity:    Worry: Not on file    Inability: Not on file  . Transportation needs:    Medical: Not on file    Non-medical: Not on file  Tobacco Use  . Smoking status: Former Games developer  . Smokeless tobacco: Never Used  Substance and Sexual Activity  . Alcohol use: Yes    Comment: several times per week  . Drug use: Yes    Types: Marijuana  . Sexual activity: Not on file  Lifestyle  . Physical activity:    Days per  week: Not on file    Minutes per session: Not on file  . Stress: Not on file  Relationships  . Social connections:    Talks on phone: Not on file    Gets together: Not on file    Attends religious service: Not on file    Active member of club or organization: Not on file    Attends meetings of clubs or organizations: Not on file    Relationship status: Not on file  . Intimate partner violence:    Fear of current or ex partner: Not on file    Emotionally abused: Not on file    Physically abused: Not on file    Forced sexual activity: Not on file  Other Topics Concern  . Not on file  Social History Narrative  . Not on file    No family history on file.  BP 109/66   Pulse 75   Ht 5\' 11"  (1.803 m)   Wt 245 lb (111.1 kg)   BMI 34.17 kg/m   Review of Systems: See HPI above.     Objective:  Physical Exam:  Gen: awake, alert, NAD, comfortable in exam room Pulm: breathing unlabored  Right shoulder: Small area of swelling noted  over the right [facility] joint. Mild tenderness over the [facility] joint Nearly Full ROM in flexion, abduction, internal/external rotation NV intact distally Special Tests:  - Impingement: Neg [place].  - Supraspinatous: Negative empty can. Strength normal. - Infraspinatous/Teres: Negative external rotation lag. Strength normal. - Biceps tendon: Negative Speeds.   - AC Joint: Mildly positive cross arm - [facility] [place]: Limited ultrasound of the right shoulder reveals mild AC joint arthropathy.  There is a subcentimeter cyst visualized superficial to the [facility] joint with apparent communication with the joint.  No increased vascularity with Doppler.  Left shoulder: Obvious atrophy of the left shoulder No focal tenderness Very limited range of motion with flexion and abduction and ER.  Normal internal rotation  Strength 5/5 IR, 3/5 ER, unable to position for empty can. N/V intact   Assessment & Plan:  1.  Right shoulder pain secondary to [facility] joint arthritis.   Patient has history of rotator cuff tear in the shoulder with atrophy.  His range of motion is surprisingly preserved as is his strength.  Back in early 2018 he was evaluated by ortho who proposed rotator cuff repair and AC joint decompression.  Today we will refer him back to Ortho for further surgery evaluation, but at this point there may be too advanced atrophy to fix surgically.  Regarding his AC joint, recommend anti-inflammatories and ice.  If symptoms fail to improve with conservative treatment, could consider aspiration of the cyst and steroid injection into the [facility] joint.  Try diclofenac for this.

## 2018-05-19 NOTE — Patient Instructions (Addendum)
Your pain is due to arthritis of your [facility] joint with a cyst. These are the different medications you can take for this: Tylenol 500mg  1-2 tabs three times a day for pain. Capsaicin, aspercreme, or biofreeze topically up to four times a day may also help with pain. Some supplements that may help for arthritis: Boswellia extract, curcumin, pycnogenol Diclofenac 75mg  twice a day with food for pain and inflammation - take for 7-10 days then as needed. Cortisone injections are an option with or without draining the cyst. It's important that you continue to stay active. Ice 15 minutes at a time 3-4 times a day as needed to help with pain. Follow up with me as needed if you're doing well. As far as surgery goes you already had atrophy back in 2017 - I doubt surgery would help unless you're having a lot of pain and they'd likely consider a cleanup surgery or a replacement if you did - I'd encourage you to follow up with the surgeon again.

## 2019-08-03 ENCOUNTER — Encounter (HOSPITAL_BASED_OUTPATIENT_CLINIC_OR_DEPARTMENT_OTHER): Payer: Self-pay

## 2019-08-03 ENCOUNTER — Other Ambulatory Visit: Payer: Self-pay

## 2019-08-03 ENCOUNTER — Emergency Department (HOSPITAL_BASED_OUTPATIENT_CLINIC_OR_DEPARTMENT_OTHER)
Admission: EM | Admit: 2019-08-03 | Discharge: 2019-08-03 | Disposition: A | Discharge status: Home / Self Care | Payer: 59 | Attending: Emergency Medicine | Admitting: Emergency Medicine

## 2019-08-03 DIAGNOSIS — M25512 Pain in left shoulder: Secondary | ICD-10-CM | POA: Insufficient documentation

## 2019-08-03 DIAGNOSIS — M25562 Pain in left knee: Secondary | ICD-10-CM | POA: Diagnosis not present

## 2019-08-03 DIAGNOSIS — R600 Localized edema: Secondary | ICD-10-CM | POA: Insufficient documentation

## 2019-08-03 DIAGNOSIS — Z87891 Personal history of nicotine dependence: Secondary | ICD-10-CM | POA: Diagnosis not present

## 2019-08-03 DIAGNOSIS — Z79899 Other long term (current) drug therapy: Secondary | ICD-10-CM | POA: Insufficient documentation

## 2019-08-03 MED ORDER — KETOROLAC TROMETHAMINE 15 MG/ML IJ SOLN
15.0000 mg | Freq: Once | INTRAMUSCULAR | Status: AC
  Administered 2019-08-03: 15 mg via INTRAMUSCULAR
  Filled 2019-08-03: qty 1

## 2019-08-03 MED ORDER — NAPROXEN 500 MG PO TABS: 500.0000 mg | ORAL_TABLET | Freq: Two times a day (BID) | ORAL | 0 refills | Status: DC

## 2019-08-03 MED FILL — NAPROXEN 500 MG TABS: 500 | 10 days supply | Qty: 21 | Fill #0

## 2019-08-03 NOTE — Discharge Instructions (Addendum)
Take naproxen 2 times a day with meals.  Do not take other anti-inflammatories at the same time (Advil, Motrin, ibuprofen, Aleve). You may supplement with Tylenol if you need further pain control. Use ice packs to help with pain and swelling. Follow-up with Dr. Barbaraann Barthel as needed for further evaluation or pain control. Return to the emergency room with any new, worsening, or concerning symptoms.

## 2019-08-03 NOTE — ED Triage Notes (Signed)
Pt c/o bilat knee pain x "years"-denies injury-NAD-limping gait

## 2019-08-03 NOTE — ED Provider Notes (Signed)
[facility] EMERGENCY DEPARTMENT Provider Note   CSN: [id number] Arrival date & time: 08/03/19  1203     History Chief Complaint  Patient presents with  . Knee Pain    Brandon Hodge is a 53 y.o. male presenting for evaluation of left knee pain and left shoulder pain.  Patient states for several years, he has had issues with both knees and his shoulder.  He states multiple times a year he will have a "flareup" where he has increased pain and swelling.  Recently, he has had a flare of his left knee.  This has been going on for about a week.  He states normally his doctor treats this with oxycodone.  He is not seeing his doctor about this recently.  He states he has a torn rotator cuff in his left shoulder which is not a candidate for surgery.  He is also having increased pain in his left shoulder since the weather has been cold.  He denies recent fall, trauma, or injury.  He denies numbness or tingling.  He took 1 dose of Tylenol several nights ago with mild improvement of symptoms, has not tried anything else.     HPI     Past Medical History:  Diagnosis Date  . Hypercholesteremia   . Rotator cuff (capsule) sprain 2013    Patient Active Problem List   Diagnosis Date Noted  . Bilateral shoulder pain 03/21/2017  . Right shoulder pain 05/30/2016  . Left shoulder pain 01/28/2015  . Preop examination 01/28/2015  . Arthritis of left acromioclavicular joint 11/11/2014  . Biceps rupture, proximal 11/11/2014  . Complete tear of left rotator cuff 11/11/2014  . Shoulder impingement 11/11/2014    Past Surgical History:  Procedure Laterality Date  . HAND SURGERY Left 2012       No family history on file.  Social History   Tobacco Use  . Smoking status: Former Research scientist (life sciences)  . Smokeless tobacco: Never Used  Substance Use Topics  . Alcohol use: Yes    Comment: weekly  . Drug use: Yes    Types: Marijuana    Home Medications Prior to Admission medications     Medication Sig Start Date End Date Taking? Authorizing Provider  diclofenac (VOLTAREN) 75 MG EC tablet Take 1 tablet (75 mg total) by mouth 2 (two) times daily. 05/19/18   Hudnall, Sharyn Lull, MD  HYDROcodone-acetaminophen (NORCO/VICODIN) 5-325 MG tablet Take 1 tablet by mouth every 6 (six) hours as needed for severe pain. 05/10/18   Ripley Fraise, MD  naproxen (NAPROSYN) 500 MG tablet Take 1 tablet (500 mg total) by mouth 2 (two) times daily with a meal. 08/03/19   Chibuikem Thang, PA-C    Allergies    Patient has no known allergies.  Review of Systems   Review of Systems  Musculoskeletal: Positive for arthralgias.  Neurological: Negative for numbness.    Physical Exam Updated Vital Signs BP (!) 139/108 (BP Location: Right Arm)   Pulse 79   Temp 98.4 F (36.9 C) (Oral)   Resp 14   Ht 5\' 11"  (1.803 m)   Wt 108.9 kg   SpO2 100%   BMI 33.47 kg/m   Physical Exam Vitals and nursing note reviewed.  Constitutional:      General: He is not in acute distress.    Appearance: He is well-developed.     Comments: Sitting comfortably in the chair no acute distress  HENT:     Head: Normocephalic and atraumatic.  Pulmonary:  Effort: Pulmonary effort is normal.  Abdominal:     General: There is no distension.  Musculoskeletal:     Cervical back: Normal range of motion.     Comments: Mild swelling of the left knee.  No obvious effusion.  No erythema, warmth, or signs of infection.  Full active range of motion of the knee without difficulty.  Pedal pulses 2+ bilaterally.  No tenderness palpation of the calf or thigh or upper thigh. No obvious deformity or swelling of the left shoulder. Unable to abduct greater than 90 degrees, baseline per pt. + arm drop, not new per pt. Radial pulses 2+ bilaterally.  Grip strength intact bilaterally.  Skin:    General: Skin is warm.     Capillary Refill: Capillary refill takes less than 2 seconds.     Findings: No rash.  Neurological:     Mental  Status: He is alert and oriented to person, place, and time.     ED Results / Procedures / Treatments   Labs (all labs ordered are listed, but only abnormal results are displayed) Labs Reviewed - No data to display  EKG None  Radiology No results found.  Procedures Procedures (including critical care time)  Medications Ordered in ED Medications  ketorolac (TORADOL) 15 MG/ML injection 15 mg (15 mg Intramuscular Given 08/03/19 1352)    ED Course  I have reviewed the triage vital signs and the nursing notes.  Pertinent labs & imaging results that were available during my care of the patient were reviewed by me and considered in my medical decision making (see chart for details).    MDM Rules/Calculators/A&P                      Patient presenting for evaluation of acute on chronic left knee and shoulder pain for the past several days.  Physical exam reassuring, no erythema, warmth, fevers, or signs of infection at either joint.  Mild swelling noted of the left knee.  Likely OA flare.  No new injury, doubt fracture dislocation, thus I do not believe x-rays would be beneficial.  Discussed with patient limitations of getting narcotic medicine from the ER.  Discussed treatment with anti-inflammatories.  Toradol shot given in the ED, will have patient take naproxen at home.  Encourage close follow-up with Ortho.  At this time, patient appears safe for discharge.  Return precautions given.  Patient states he understands and agrees to plan.  Final Clinical Impression(s) / ED Diagnoses Final diagnoses:  Left knee pain, unspecified chronicity  Left shoulder pain, unspecified chronicity    Rx / DC Orders ED Discharge Orders         Ordered    naproxen (NAPROSYN) 500 MG tablet  2 times daily with meals     08/03/19 1342           Audrianna Driskill, PA-C 08/03/19 1437    Alvira Monday, MD 08/04/19 [phone]

## 2019-10-19 ENCOUNTER — Ambulatory Visit: Payer: 59 | Admitting: Family Medicine

## 2019-10-28 ENCOUNTER — Encounter: Payer: Self-pay | Admitting: Family Medicine

## 2020-04-04 ENCOUNTER — Ambulatory Visit: Payer: 59 | Admitting: Physical Therapy

## 2020-04-07 ENCOUNTER — Ambulatory Visit: Payer: 59 | Admitting: Physical Therapy

## 2020-04-07 ENCOUNTER — Other Ambulatory Visit: Payer: Self-pay

## 2020-04-13 ENCOUNTER — Ambulatory Visit: Payer: 59 | Attending: Orthopaedic Surgery | Admitting: Physical Therapy

## 2020-04-13 ENCOUNTER — Other Ambulatory Visit: Payer: Self-pay

## 2020-04-13 ENCOUNTER — Encounter: Payer: Self-pay | Admitting: Physical Therapy

## 2020-04-13 DIAGNOSIS — M25622 Stiffness of left elbow, not elsewhere classified: Secondary | ICD-10-CM

## 2020-04-13 DIAGNOSIS — M25612 Stiffness of left shoulder, not elsewhere classified: Secondary | ICD-10-CM | POA: Insufficient documentation

## 2020-04-13 DIAGNOSIS — R293 Abnormal posture: Secondary | ICD-10-CM | POA: Diagnosis present

## 2020-04-13 DIAGNOSIS — M6281 Muscle weakness (generalized): Secondary | ICD-10-CM | POA: Diagnosis present

## 2020-04-13 DIAGNOSIS — M25512 Pain in left shoulder: Secondary | ICD-10-CM | POA: Insufficient documentation

## 2020-04-13 NOTE — Therapy (Signed)
[facility] [address]  [address] [place], [place], [address] Phone: [phone]   Fax:  [phone]  Physical Therapy Evaluation  Patient Details  Name: Brandon Hodge MRN: [id number] Date of Birth: [birth year] Referring Provider (PT): Ramond Marrow, MD   Encounter Date: 04/13/2020   PT End of Session - 04/13/20 1733    Visit Number 1    Number of Visits 17    Date for PT Re-Evaluation 06/08/20    Authorization Type UHC    PT Start Time 1536    PT Stop Time 1613    PT Time Calculation (min) 37 min    Activity Tolerance Patient tolerated treatment well;Patient limited by pain    Behavior During Therapy [facility] for tasks assessed/performed           Past Medical History:  Diagnosis Date  . Hypercholesteremia   . Rotator cuff (capsule) sprain 2013    Past Surgical History:  Procedure Laterality Date  . HAND SURGERY Left 2012    There were no vitals filed for this visit.    Subjective Assessment - 04/13/20 1537    Subjective Patient reports undergoing L RTC repair and biceps tenodesis on 03/07/20. Has worked on some of his own exercises at home. Still having some diffuse L shoulder soreness and intermittently has a burning sensation over the lateral side of the shoulder. Better with meds. Does not ice as much as he should. Notes that he wears a sling but has not worn it this week despite MD instruction to leave sling on until next F/U. Starting to notice some problems with R shoulder d/t compensations for the L side.    Pertinent History HLD, L hand surgery, hx unintentional opiate overdose x2    Limitations House hold activities;Lifting    Diagnostic tests none recent    Patient Stated Goals get back to playing basketball    Currently in Pain? Yes    Pain Score 4     Pain Location Shoulder    Pain Orientation Left    Pain Descriptors / Indicators Sore    Pain Type Acute pain;Surgical pain              [facility] PT  Assessment - 04/13/20 1544      Assessment   Medical Diagnosis L shoulder arthroscopy, extensive debridemnt, subacromial decompression, biceps tenodesis, and RTC repair    Referring Provider (PT) Ramond Marrow, MD    Onset Date/Surgical Date 03/07/20    Hand Dominance Left    Next MD Visit 04/19/20    Prior Therapy yes      Precautions   Precaution Comments no resisted elbow flexion x4 weeks, L arm in sling until f/u with MD; hx of unintentional opiate overdose      Balance Screen   Has the patient fallen in the past 6 months No    Has the patient had a decrease in activity level because of a fear of falling?  No    Is the patient reluctant to leave their home because of a fear of falling?  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    Leisure basketball  Cognition   Overall Cognitive Status Within Functional Limits for tasks assessed      Observation/Other Assessments   Observations L UE out of sling      Sensation   Light Touch Appears Intact      Coordination   Gross Motor Movements are Fluid and Coordinated Yes      Posture/Postural Control   Posture/Postural Control Postural limitations    Postural Limitations Rounded Shoulders      ROM / Strength   AROM / PROM / Strength AROM;PROM;Strength      AROM   AROM Assessment Site Shoulder;Elbow    Right/Left Shoulder Right    Right Shoulder Flexion 158 Degrees    Right Shoulder ABduction 120 Degrees    Right Shoulder Internal Rotation --   FIR L1   Right Shoulder External Rotation --   FER C6   Right/Left Elbow Right;Left    Right Elbow Flexion 136    Right Elbow Extension 30    Left Elbow Flexion 16    Left Elbow Extension 128      PROM   PROM Assessment Site Shoulder      Right/Left Shoulder Left    Left Shoulder Flexion 114 Degrees    Left Shoulder ABduction 85 Degrees    Left Shoulder Internal Rotation 45 Degrees    Left Shoulder External Rotation 24 Degrees      Strength   Strength Assessment Site Shoulder    Right/Left Shoulder Right    Right Shoulder Flexion 4+/5    Right Shoulder ABduction 4+/5    Right Shoulder Internal Rotation 4+/5    Right Shoulder External Rotation 4/5      Palpation   Palpation comment no TTP over L shoulder; incisions well-healed                      Objective measurements completed on examination: See above findings.               PT Education - 04/13/20 1732    Education Details prognosis, POC, HEP; educated patient on consistent sling use, ice for 10-15 min for pain relief, and post-op precautions    Person(s) Educated Patient    Methods Explanation;Demonstration;Tactile cues;Verbal cues;Handout    Comprehension Verbalized understanding;Returned demonstration            PT Short Term Goals - 04/13/20 1742      PT SHORT TERM GOAL #1   Title Patient to be independent with initial HEP.    Time 3    Period Weeks    Status New    Target Date 05/04/20             PT Long Term Goals - 04/13/20 1742      PT LONG TERM GOAL #1   Title Patient to be independent with advanced HEP.    Time 8    Period Weeks    Status New    Target Date 06/08/20      PT LONG TERM GOAL #2   Title Patient to demonstrate L shoulder and elbow AROM/PROM [facility] w/o limitation d/t pain.    Time 8    Period Weeks    Status New    Target Date 06/08/20      PT LONG TERM GOAL #3   Title Patient to demonstrate L shoulder and elbow strength >/= 4/5 for improved function.    Time 8    Period Weeks    Status New  Target Date 06/08/20      PT LONG TERM GOAL #4   Title Patient to demonstrate L shoulder overhead lift with 5lbs with good scapular mechanics.    Time 8    Period Weeks    Status New     Target Date 06/08/20      PT LONG TERM GOAL #5   Title Patient to report tolerance for driving for 2+ hours without pain in order to return to work.    Time 8    Period Weeks    Status New    Target Date 06/08/20                  Plan - 04/13/20 1734    Clinical Impression Statement Patient is a 53y/o M presenting to OPPT with c/o L shoulder pain s/p L shoulder arthroscopy, extensive debridement, subacromial decompression, biceps tenodesis, and RTC repair on 03/07/20. Patient admits to recently poor compliance with sling and ice and reports performing resisted exercises at home that he was not yet advised to do. Shoulder pain is diffuse but well-managed by meds. Patient today presenting with rounded shoulders, limited L shoulder PROM, and limited L elbow AROM. Patient was educated on gentle postural correction and PROM HEP. Patient was educated on use of ice, consistency of sling use, and post-op precautions, including to stop exercises that he was not instructed to perform. Patient reported understanding. Would benefit from skilled PT services 2x/week for 8 weeks to address aforementioned impairments.    Personal Factors and Comorbidities Age;Sex;Comorbidity 3+;Past/Current Experience;Profession;Time since onset of injury/illness/exacerbation;Behavior Pattern    Comorbidities HLD, L hand surgery, hx unintentional opiate overdose x2    Examination-Activity Limitations Sleep;Carry;Dressing;Hygiene/Grooming;Lift;Reach Overhead    Examination-Participation Restrictions Cleaning;Shop;Driving;Laundry;Meal Prep;Occupation    Stability/Clinical Decision Making Stable/Uncomplicated    Clinical Decision Making Low    Rehab Potential Good    PT Frequency 2x / week    PT Duration 8 weeks    PT Treatment/Interventions ADLs/Self Care Home Management;Cryotherapy;Electrical Stimulation;Moist Heat;Therapeutic exercise;Therapeutic activities;Functional mobility training;Ultrasound;Neuromuscular  re-education;Patient/family education;Manual techniques;Vasopneumatic Device;Taping;Energy conservation;Dry needling;Passive range of motion;Scar mobilization    PT Next Visit Plan reassess HEP and compliance with sling/precautions, R elbow MMT, pendulums, shoulder PROM    Consulted and Agree with Plan of Care Patient           Patient will benefit from skilled therapeutic intervention in order to improve the following deficits and impairments:  Hypomobility, Increased edema, Decreased scar mobility, Decreased knowledge of precautions, Decreased activity tolerance, Decreased strength, Pain, Increased muscle spasms, Improper body mechanics, Decreased range of motion, Postural dysfunction, Impaired flexibility  Visit Diagnosis: Acute pain of left shoulder  Stiffness of left shoulder, not elsewhere classified  Stiffness of left elbow, not elsewhere classified  Muscle weakness (generalized)  Abnormal posture     Problem List Patient Active Problem List   Diagnosis Date Noted  . Bilateral shoulder pain 03/21/2017  . Right shoulder pain 05/30/2016  . Left shoulder pain 01/28/2015  . Preop examination 01/28/2015  . Arthritis of left acromioclavicular joint 11/11/2014  . Biceps rupture, proximal 11/11/2014  . Complete tear of left rotator cuff 11/11/2014  . Shoulder impingement 11/11/2014    Anette Guarneri, PT, DPT 04/13/20 5:48 PM   [facility] [address]  [address] [place], [place], [address] Phone: [phone]   Fax:  [phone]  Name: Hyde Sires MRN: [id number] Date of Birth: Aug 31, 1965

## 2020-04-20 ENCOUNTER — Other Ambulatory Visit: Payer: Self-pay

## 2020-04-20 ENCOUNTER — Ambulatory Visit: Payer: 59

## 2020-04-20 DIAGNOSIS — M25512 Pain in left shoulder: Secondary | ICD-10-CM

## 2020-04-20 DIAGNOSIS — M25622 Stiffness of left elbow, not elsewhere classified: Secondary | ICD-10-CM

## 2020-04-20 DIAGNOSIS — R293 Abnormal posture: Secondary | ICD-10-CM

## 2020-04-20 DIAGNOSIS — M25612 Stiffness of left shoulder, not elsewhere classified: Secondary | ICD-10-CM

## 2020-04-20 DIAGNOSIS — M6281 Muscle weakness (generalized): Secondary | ICD-10-CM

## 2020-04-20 NOTE — Therapy (Signed)
[facility] [address]  [address] [place], [place], [address] Phone: [phone]   Fax:  [phone]  Physical Therapy Treatment  Patient Details  Name: Brandon Hodge MRN: [id number] Date of Birth: 1965/10/23 Referring Provider (PT): Ramond Marrow, MD   Encounter Date: 04/20/2020   PT End of Session - 04/20/20 1451    Visit Number 2    Number of Visits 17    Date for PT Re-Evaluation 06/08/20    Authorization Type UHC    PT Start Time 1447    PT Stop Time 1540    PT Time Calculation (min) 53 min    Activity Tolerance Patient tolerated treatment well    Behavior During Therapy [facility] for tasks assessed/performed           Past Medical History:  Diagnosis Date   Hypercholesteremia    Rotator cuff (capsule) sprain 2013    Past Surgical History:  Procedure Laterality Date   HAND SURGERY Left 2012    There were no vitals filed for this visit.   Subjective Assessment - 04/20/20 1450    Subjective Pt. reporting he was removed from the sling by MD at recent f/u.    Pertinent History HLD, L hand surgery, hx unintentional opiate overdose x2    Diagnostic tests none recent    Patient Stated Goals get back to playing basketball    Currently in Pain? No/denies    Pain Score 0-No pain    Pain Location Shoulder    Pain Orientation Left    Pain Descriptors / Indicators Sore    Pain Type Acute pain;Surgical pain                             [facility] Adult PT Treatment/Exercise - 04/20/20 0001      Elbow Exercises   Elbow Flexion Left;10 reps;AROM    Elbow Flexion Limitations AROM       Shoulder Exercises: Supine   Flexion Left;AAROM;10 reps   2 sets    Flexion Limitations use of R UE to assist      Shoulder Exercises: Standing   Other Standing Exercises L shoulder pendulums: CW, CCW, forward/backwards x 30 sec       Hand Exercises   Other Hand Exercises L towel squeezes 5" x 10      Modalities    Modalities Vasopneumatic      Vasopneumatic   Number Minutes Vasopneumatic  10 minutes    Vasopnuematic Location  Shoulder   L    Vasopneumatic Pressure Low    Vasopneumatic Temperature  coldest temp.       Manual Therapy   Manual Therapy Passive ROM    Manual therapy comments supine     Passive ROM L shoulder PROM within protocol          Therex: Seated scapular retraction 5" x 10 - tactile cueing to increased scapular retraction ROM           PT Short Term Goals - 04/20/20 1500      PT SHORT TERM GOAL #1   Title Patient to be independent with initial HEP.    Time 3    Period Weeks    Status On-going    Target Date 05/04/20             PT Long Term Goals - 04/20/20 1500      PT LONG TERM GOAL #1  Title Patient to be independent with advanced HEP.    Time 8    Period Weeks    Status On-going      PT LONG TERM GOAL #2   Title Patient to demonstrate L shoulder and elbow AROM/PROM [facility] w/o limitation d/t pain.    Time 8    Period Weeks    Status On-going      PT LONG TERM GOAL #3   Title Patient to demonstrate L shoulder and elbow strength >/= 4/5 for improved function.    Time 8    Period Weeks    Status On-going      PT LONG TERM GOAL #4   Title Patient to demonstrate L shoulder overhead lift with 5lbs with good scapular mechanics.    Time 8    Period Weeks    Status On-going      PT LONG TERM GOAL #5   Title Patient to report tolerance for driving for 2+ hours without pain in order to return to work.    Time 8    Period Weeks    Status On-going                 Plan - 04/20/20 1503    Clinical Impression Statement  Brandon Hodge doing well today.  Notes he saw MD earlier today and was d/c from the sling and told not to lift greater than 2# with L UE.  Able to demonstrate moderate improvement in flexion ROM with PROM + prolonged holds requiring frequent cueing to relax L UE musculature.  Instruction required for pt. to understanding/demo  proper PROM shoulder pendulums today however good carryover.  Tolerated all HEP activities review well and was able to complete therex with only complaint of L shoulder stiffness per protocol.     Comorbidities HLD, L hand surgery, hx unintentional opiate overdose x2    Rehab Potential Good    PT Frequency 2x / week    PT Duration 8 weeks    PT Treatment/Interventions ADLs/Self Care Home Management;Cryotherapy;Electrical Stimulation;Moist Heat;Therapeutic exercise;Therapeutic activities;Functional mobility training;Ultrasound;Neuromuscular re-education;Patient/family education;Manual techniques;Vasopneumatic Device;Taping;Energy conservation;Dry needling;Passive range of motion;Scar mobilization    PT Next Visit Plan Ongoing assessment compliance with precautions, R elbow MMT, pendulums, shoulder PROM    Consulted and Agree with Plan of Care Patient           Patient will benefit from skilled therapeutic intervention in order to improve the following deficits and impairments:  Hypomobility, Increased edema, Decreased scar mobility, Decreased knowledge of precautions, Decreased activity tolerance, Decreased strength, Pain, Increased muscle spasms, Improper body mechanics, Decreased range of motion, Postural dysfunction, Impaired flexibility  Visit Diagnosis: Acute pain of left shoulder  Stiffness of left shoulder, not elsewhere classified  Stiffness of left elbow, not elsewhere classified  Muscle weakness (generalized)  Abnormal posture     Problem List Patient Active Problem List   Diagnosis Date Noted   Bilateral shoulder pain 03/21/2017   Right shoulder pain 05/30/2016   Left shoulder pain 01/28/2015   Preop examination 01/28/2015   Arthritis of left acromioclavicular joint 11/11/2014   Biceps rupture, proximal 11/11/2014   Complete tear of left rotator cuff 11/11/2014   Shoulder impingement 11/11/2014    Kermit Balo, PTA 04/20/20 6:06 PM   [facility] [address]  [address] [place], [place], [address] Phone: [phone]   Fax:  [phone]  Name: Brandon Hodge MRN: [id number] Date of Birth: 03-Nov-1965

## 2020-04-22 ENCOUNTER — Other Ambulatory Visit: Payer: Self-pay

## 2020-04-22 ENCOUNTER — Ambulatory Visit: Payer: 59 | Admitting: Physical Therapy

## 2020-04-22 ENCOUNTER — Encounter: Payer: Self-pay | Admitting: Physical Therapy

## 2020-04-22 DIAGNOSIS — R293 Abnormal posture: Secondary | ICD-10-CM

## 2020-04-22 DIAGNOSIS — M25612 Stiffness of left shoulder, not elsewhere classified: Secondary | ICD-10-CM

## 2020-04-22 DIAGNOSIS — M25512 Pain in left shoulder: Secondary | ICD-10-CM

## 2020-04-22 DIAGNOSIS — M6281 Muscle weakness (generalized): Secondary | ICD-10-CM

## 2020-04-22 DIAGNOSIS — M25622 Stiffness of left elbow, not elsewhere classified: Secondary | ICD-10-CM

## 2020-04-22 NOTE — Therapy (Signed)
[facility] [address]  [address] [place], [place], [address] Phone: [phone]   Fax:  [phone]  Physical Therapy Treatment  Patient Details  Name: Brandon Hodge MRN: [id number] Date of Birth: 1966/07/05 Referring Provider (PT): Ramond Marrow, MD   Encounter Date: 04/22/2020   PT End of Session - 04/22/20 0904    Visit Number 3    Number of Visits 17    Date for PT Re-Evaluation 06/08/20    Authorization Type UHC    PT Start Time 0820    PT Stop Time 0913    PT Time Calculation (min) 53 min    Activity Tolerance Patient tolerated treatment well;Patient limited by pain    Behavior During Therapy [facility] for tasks assessed/performed           Past Medical History:  Diagnosis Date  . Hypercholesteremia   . Rotator cuff (capsule) sprain 2013    Past Surgical History:  Procedure Laterality Date  . HAND SURGERY Left 2012    There were no vitals filed for this visit.   Subjective Assessment - 04/22/20 0821    Subjective Reports that with movement of his L arm he notices a "sawing" pain over the anterior proximal humerus. Denies performing overhead reaching or lifting with the L arm. Did not take his pain medicine this AM.    Pertinent History HLD, L hand surgery, hx unintentional opiate overdose x2    Diagnostic tests none recent    Patient Stated Goals get back to playing basketball    Currently in Pain? No/denies    Pain Score 4     Pain Location Shoulder    Pain Orientation Left    Pain Descriptors / Indicators Sore    Pain Type Acute pain;Surgical pain              [facility] PT Assessment - 04/22/20 0001      Strength   Strength Assessment Site Elbow    Right/Left Shoulder Left    Right/Left Elbow Right;Left    Right Elbow Flexion 5/5    Right Elbow Extension 4+/5    Left Elbow Flexion 3+/5    Left Elbow Extension 4/5                         [facility] Adult PT Treatment/Exercise -  04/22/20 0001      Exercises   Exercises Shoulder;Elbow      Elbow Exercises   Forearm Supination AROM;Self ROM;Left;5 reps;Seated    Forearm Supination Limitations AROM with self-OP    Other elbow exercises L elbow flexion isometrics at 50% effort 2 sets 5x10"      Shoulder Exercises: Seated   Other Seated Exercises B shoulder circles 10x forward, 10x back   cues to increase ROM     Shoulder Exercises: Standing   Other Standing Exercises L shoulder pendulums: CW, CCW, forward/backwards x 30 sec    cues to avoid active shoulder movement     Shoulder Exercises: Stretch   Other Shoulder Stretches L LS stretch 30" w/ hands anchored on mat    Other Shoulder Stretches L UT stretch 30" w/ hands anchored on mat      Vasopneumatic   Number Minutes Vasopneumatic  15 minutes    Vasopnuematic Location  Shoulder   L   Vasopneumatic Pressure Low    Vasopneumatic Temperature  coldest temp.       Manual Therapy  Manual Therapy Passive ROM;Joint mobilization;Soft tissue mobilization    Manual therapy comments supine     Joint Mobilization L shoulder anterior, posterior, inferior jt mobs grade III to tolerance    Soft tissue mobilization STM to L proximal biceps tendon, pec, biceps muscle belly   no TTP but considerable soft tissue restriction throughout   Passive ROM L shoulder PROM in all planes within protocol/tolerance; L elbow supination PROM to tolerance                  PT Education - 04/22/20 0900    Education Details update to HEP    Person(s) Educated Patient    Methods Explanation;Demonstration;Tactile cues;Verbal cues;Handout    Comprehension Verbalized understanding;Returned demonstration            PT Short Term Goals - 04/20/20 1500      PT SHORT TERM GOAL #1   Title Patient to be independent with initial HEP.    Time 3    Period Weeks    Status On-going    Target Date 05/04/20             PT Long Term Goals - 04/20/20 1500      PT LONG TERM GOAL #1    Title Patient to be independent with advanced HEP.    Time 8    Period Weeks    Status On-going      PT LONG TERM GOAL #2   Title Patient to demonstrate L shoulder and elbow AROM/PROM [facility] w/o limitation d/t pain.    Time 8    Period Weeks    Status On-going      PT LONG TERM GOAL #3   Title Patient to demonstrate L shoulder and elbow strength >/= 4/5 for improved function.    Time 8    Period Weeks    Status On-going      PT LONG TERM GOAL #4   Title Patient to demonstrate L shoulder overhead lift with 5lbs with good scapular mechanics.    Time 8    Period Weeks    Status On-going      PT LONG TERM GOAL #5   Title Patient to report tolerance for driving for 2+ hours without pain in order to return to work.    Time 8    Period Weeks    Status On-going                 Plan - 04/22/20 0905    Clinical Impression Statement Patient reporting intermittent pain over the L anterior proximal humerus with movement. However, patient reports compliance with post-op precautions. Continued with pendulums with patient requiring minor cueing to prevent active L shoulder movement, otherwise with good carryover. Initiated gentle elbow flexion isometrics with light manual resistance, which patient performed without pain. Patient demonstrated difficulty performing elbow supination, thus worked on self-stretching to tolerance. Patient was able to tolerate L shoulder and elbow PROM in all planes to tolerance and within limits of protocol. Did seem to demonstrate improvement in ER today but noted some pain with flexion. Patient demonstrated considerable soft tissue restriction throughout the L pec and biceps with STM, but without TTP. Updated HEP with exercises that were well-tolerated today. Ended session with [place] to L shoulder. Patient reported understanding of edu provided today and without complaints at end of session.    Comorbidities HLD, L hand surgery, hx unintentional opiate  overdose x2    Rehab Potential Good    PT  Frequency 2x / week    PT Duration 8 weeks    PT Treatment/Interventions ADLs/Self Care Home Management;Cryotherapy;Electrical Stimulation;Moist Heat;Therapeutic exercise;Therapeutic activities;Functional mobility training;Ultrasound;Neuromuscular re-education;Patient/family education;Manual techniques;Vasopneumatic Device;Taping;Energy conservation;Dry needling;Passive range of motion;Scar mobilization    PT Next Visit Plan Ongoing assessment compliance with precautions, B elbow supination/pronation AROM measurements, pendulums, shoulder PROM    Consulted and Agree with Plan of Care Patient           Patient will benefit from skilled therapeutic intervention in order to improve the following deficits and impairments:  Hypomobility, Increased edema, Decreased scar mobility, Decreased knowledge of precautions, Decreased activity tolerance, Decreased strength, Pain, Increased muscle spasms, Improper body mechanics, Decreased range of motion, Postural dysfunction, Impaired flexibility  Visit Diagnosis: Acute pain of left shoulder  Stiffness of left shoulder, not elsewhere classified  Stiffness of left elbow, not elsewhere classified  Muscle weakness (generalized)  Abnormal posture     Problem List Patient Active Problem List   Diagnosis Date Noted  . Bilateral shoulder pain 03/21/2017  . Right shoulder pain 05/30/2016  . Left shoulder pain 01/28/2015  . Preop examination 01/28/2015  . Arthritis of left acromioclavicular joint 11/11/2014  . Biceps rupture, proximal 11/11/2014  . Complete tear of left rotator cuff 11/11/2014  . Shoulder impingement 11/11/2014     Anette Guarneri, PT, DPT 04/22/20 9:16 AM   [facility] [address]  [address] [place], [place], [address] Phone: [phone]   Fax:  [phone]  Name: Brandon Hodge MRN: [id number] Date of Birth:  February 07, 1966

## 2020-04-25 ENCOUNTER — Ambulatory Visit: Payer: 59

## 2020-04-25 ENCOUNTER — Other Ambulatory Visit: Payer: Self-pay

## 2020-04-25 DIAGNOSIS — M25512 Pain in left shoulder: Secondary | ICD-10-CM | POA: Diagnosis not present

## 2020-04-25 DIAGNOSIS — M25622 Stiffness of left elbow, not elsewhere classified: Secondary | ICD-10-CM

## 2020-04-25 DIAGNOSIS — M6281 Muscle weakness (generalized): Secondary | ICD-10-CM

## 2020-04-25 DIAGNOSIS — M25612 Stiffness of left shoulder, not elsewhere classified: Secondary | ICD-10-CM

## 2020-04-25 DIAGNOSIS — R293 Abnormal posture: Secondary | ICD-10-CM

## 2020-04-25 NOTE — Therapy (Signed)
[facility] [address]  [address] [place], [place], [address] Phone: [phone]   Fax:  [phone]  Physical Therapy Treatment  Patient Details  Name: Brandon Hodge MRN: [id number] Date of Birth: 22-May-1966 Referring Provider (PT): Ramond Marrow, MD   Encounter Date: 04/25/2020   PT End of Session - 04/25/20 1407    Visit Number 4    Number of Visits 17    Date for PT Re-Evaluation 06/08/20    Authorization Type UHC    PT Start Time 1401    PT Stop Time 1452    PT Time Calculation (min) 51 min    Activity Tolerance Patient tolerated treatment well    Behavior During Therapy [facility] for tasks assessed/performed           Past Medical History:  Diagnosis Date  . Hypercholesteremia   . Rotator cuff (capsule) sprain 2013    Past Surgical History:  Procedure Laterality Date  . HAND SURGERY Left 2012    There were no vitals filed for this visit.   Subjective Assessment - 04/25/20 1405    Subjective Pt. reporting primarily "stiffness"/"soreness" today.    Pertinent History HLD, L hand surgery, hx unintentional opiate overdose x2    Diagnostic tests none recent    Patient Stated Goals get back to playing basketball    Currently in Pain? Yes    Pain Score 3     Pain Location Shoulder    Pain Orientation Left    Pain Descriptors / Indicators Sore    Pain Type Acute pain;Surgical pain    Multiple Pain Sites No              [facility] PT Assessment - 04/25/20 0001      PROM   Left Shoulder Flexion 150 Degrees   supine with assistance of wand    Left Shoulder ABduction 90 Degrees   PROM   Left Shoulder Internal Rotation 63 Degrees   PROM   Left Shoulder External Rotation 40 Degrees   supine with assistance of wand                         [facility] Adult PT Treatment/Exercise - 04/25/20 0001      Shoulder Exercises: Supine   External Rotation Left;10 reps;AAROM    External Rotation Limitations wand -  pillow under elbow     Flexion Left;10 reps;AAROM    Flexion Limitations wand to tolerance     ABduction Left;10 reps;AAROM    ABduction Limitations wand scaption      Shoulder Exercises: Seated   Other Seated Exercises seated scaption 5" x 10      Shoulder Exercises: Pulleys   Flexion 3 minutes    Flexion Limitations to tolerance     Scaption 3 minutes    Scaption Limitations to tolerance       Vasopneumatic   Number Minutes Vasopneumatic  10 minutes    Vasopnuematic Location  Shoulder   L    Vasopneumatic Pressure Low    Vasopneumatic Temperature  coldest temp.       Manual Therapy   Manual Therapy Passive ROM    Passive ROM L shoulder PROM tolerance avoiding pure abduction more scaption                   PT Education - 04/25/20 1439    Education Details HEP update    Person(s) Educated Patient  Methods Explanation;Demonstration;Verbal cues;Handout    Comprehension Verbalized understanding;Returned demonstration;Verbal cues required            PT Short Term Goals - 04/20/20 1500      PT SHORT TERM GOAL #1   Title Patient to be independent with initial HEP.    Time 3    Period Weeks    Status On-going    Target Date 05/04/20             PT Long Term Goals - 04/20/20 1500      PT LONG TERM GOAL #1   Title Patient to be independent with advanced HEP.    Time 8    Period Weeks    Status On-going      PT LONG TERM GOAL #2   Title Patient to demonstrate L shoulder and elbow AROM/PROM [facility] w/o limitation d/t pain.    Time 8    Period Weeks    Status On-going      PT LONG TERM GOAL #3   Title Patient to demonstrate L shoulder and elbow strength >/= 4/5 for improved function.    Time 8    Period Weeks    Status On-going      PT LONG TERM GOAL #4   Title Patient to demonstrate L shoulder overhead lift with 5lbs with good scapular mechanics.    Time 8    Period Weeks    Status On-going      PT LONG TERM GOAL #5   Title Patient to report  tolerance for driving for 2+ hours without pain in order to return to work.    Time 8    Period Weeks    Status On-going                 Plan - 04/25/20 1408    Clinical Impression Statement Pt. doing well noting L shoulder soreness/stiffness primarily over the last few days.  Was able to demo good improvement in L shoulder PROM/AAROM flexion, ER, IR, abduction today (see objective measurements).  Initiated AAROM supine wand activities for improved L shoulder ROM which were tolerated well with pt. demonstrating good understanding of need to avoid painful arc of motion.  HEP updated to add supine wand flexion, ER to HEP.  Ended visit with ice/compression to L shoulder to reduce soreness and swelling.    Examination-Activity Limitations Sleep;Carry;Dressing;Hygiene/Grooming;Lift;Reach Overhead    Rehab Potential Good    PT Frequency 2x / week    PT Duration 8 weeks    PT Treatment/Interventions ADLs/Self Care Home Management;Cryotherapy;Electrical Stimulation;Moist Heat;Therapeutic exercise;Therapeutic activities;Functional mobility training;Ultrasound;Neuromuscular re-education;Patient/family education;Manual techniques;Vasopneumatic Device;Taping;Energy conservation;Dry needling;Passive range of motion;Scar mobilization    PT Next Visit Plan Ongoing assessment compliance with precautions, B elbow supination/pronation AROM measurements, pendulums, shoulder PROM    Consulted and Agree with Plan of Care Patient           Patient will benefit from skilled therapeutic intervention in order to improve the following deficits and impairments:  Hypomobility, Increased edema, Decreased scar mobility, Decreased knowledge of precautions, Decreased activity tolerance, Decreased strength, Pain, Increased muscle spasms, Improper body mechanics, Decreased range of motion, Postural dysfunction, Impaired flexibility  Visit Diagnosis: Acute pain of left shoulder  Stiffness of left shoulder, not  elsewhere classified  Stiffness of left elbow, not elsewhere classified  Muscle weakness (generalized)  Abnormal posture     Problem List Patient Active Problem List   Diagnosis Date Noted  . Bilateral shoulder pain 03/21/2017  .  Right shoulder pain 05/30/2016  . Left shoulder pain 01/28/2015  . Preop examination 01/28/2015  . Arthritis of left acromioclavicular joint 11/11/2014  . Biceps rupture, proximal 11/11/2014  . Complete tear of left rotator cuff 11/11/2014  . Shoulder impingement 11/11/2014    Kermit Balo, PTA 04/25/20 3:03 PM   [facility] [address]  [address] [place], [place], [address] Phone: [phone]   Fax:  [phone]  Name: Brandon Hodge MRN: [id number] Date of Birth: May 06, 1966

## 2020-04-26 ENCOUNTER — Encounter: Payer: Self-pay | Admitting: Physical Therapy

## 2020-04-26 ENCOUNTER — Ambulatory Visit: Payer: 59 | Admitting: Physical Therapy

## 2020-04-26 DIAGNOSIS — M25512 Pain in left shoulder: Secondary | ICD-10-CM

## 2020-04-26 DIAGNOSIS — R293 Abnormal posture: Secondary | ICD-10-CM

## 2020-04-26 DIAGNOSIS — M25622 Stiffness of left elbow, not elsewhere classified: Secondary | ICD-10-CM

## 2020-04-26 DIAGNOSIS — M25612 Stiffness of left shoulder, not elsewhere classified: Secondary | ICD-10-CM

## 2020-04-26 DIAGNOSIS — M6281 Muscle weakness (generalized): Secondary | ICD-10-CM

## 2020-04-26 NOTE — Therapy (Signed)
[facility]patient Rehabilitation [facility] [address]  [address] [place], [place], [address] Phone: [phone]   Fax:  [phone]  Physical Therapy Treatment  Patient Details  Name: Brandon Hodge MRN: [id number] Date of Birth: 1965/12/18 Referring Provider (PT): Ramond Marrow, MD   Encounter Date: 04/26/2020   PT End of Session - 04/26/20 1353    Visit Number 5    Number of Visits 17    Date for PT Re-Evaluation 06/08/20    Authorization Type UHC    PT Start Time 1308    PT Stop Time 1401    PT Time Calculation (min) 53 min    Activity Tolerance Patient tolerated treatment well;Patient limited by pain    Behavior During Therapy [facility] for tasks assessed/performed           Past Medical History:  Diagnosis Date  . Hypercholesteremia   . Rotator cuff (capsule) sprain 2013    Past Surgical History:  Procedure Laterality Date  . HAND SURGERY Left 2012    There were no vitals filed for this visit.   Subjective Assessment - 04/26/20 1309    Subjective Was sore last night after last session. Noticing the he was able to put his earring back in on his own today. Forgot about doing the supination stretch at home, otherwise exercises going okay.    Pertinent History HLD, L hand surgery, hx unintentional opiate overdose x2    Diagnostic tests none recent    Patient Stated Goals get back to playing basketball    Currently in Pain? Yes    Pain Score 4     Pain Location Shoulder    Pain Orientation Left    Pain Descriptors / Indicators Dull    Pain Type Acute pain;Surgical pain              [facility] PT Assessment - 04/26/20 0001      AROM   AROM Assessment Site Forearm    Right/Left Forearm Right;Left    Right Forearm Pronation 86 Degrees    Right Forearm Supination 64 Degrees    Left Forearm Pronation 82 Degrees    Left Forearm Supination 59 Degrees                         [facility] Adult PT Treatment/Exercise - 04/26/20 0001        Shoulder Exercises: Supine   External Rotation Left;10 reps;AAROM    External Rotation Limitations wand to tolerance   cues to maintain elbow by side   Flexion Left;10 reps;AAROM    Flexion Limitations wand to tolerance     ABduction Left;AAROM;5 reps    ABduction Limitations wand scaption; slight assist from PT   reps limited d/t discomfort and difficulty     Shoulder Exercises: Standing   Row Strengthening;Both;10 reps;Theraband    Theraband Level (Shoulder Row) Level 1 (Yellow)    Row Limitations small amplitude movement, avoiding shoulder extension past neutral   cues for scapular retraction and depression     Shoulder Exercises: Pulleys   Flexion 3 minutes    Flexion Limitations to tolerance     Scaption 3 minutes    Scaption Limitations to tolerance    cues to avoid pushing into pain     Vasopneumatic   Number Minutes Vasopneumatic  10 minutes    Vasopnuematic Location  Shoulder   L   Vasopneumatic Pressure Low    Vasopneumatic Temperature  coldest temp.  Manual Therapy   Manual Therapy Passive ROM;Soft tissue mobilization;Myofascial release    Manual therapy comments supine    Soft tissue mobilization STM to L biceps muscle belly    Myofascial Release manual TPR to L biceps muscle belly    Passive ROM L shoulder PROM tolerance avoiding pure abduction more scaption; L elbow flexion/extension and supination with prolonged holds at end ranges to tolerance   still some muscle guarding, but improved                 PT Education - 04/26/20 1351    Education Details educated patient on use of moist heat before exercise; encouraged patient to increase consistency with recently updated HEP exercises    Person(s) Educated Patient    Methods Explanation;Demonstration;Tactile cues;Verbal cues    Comprehension Verbalized understanding            PT Short Term Goals - 04/20/20 1500      PT SHORT TERM GOAL #1   Title Patient to be independent with initial HEP.     Time 3    Period Weeks    Status On-going    Target Date 05/04/20             PT Long Term Goals - 04/20/20 1500      PT LONG TERM GOAL #1   Title Patient to be independent with advanced HEP.    Time 8    Period Weeks    Status On-going      PT LONG TERM GOAL #2   Title Patient to demonstrate L shoulder and elbow AROM/PROM [facility] w/o limitation d/t pain.    Time 8    Period Weeks    Status On-going      PT LONG TERM GOAL #3   Title Patient to demonstrate L shoulder and elbow strength >/= 4/5 for improved function.    Time 8    Period Weeks    Status On-going      PT LONG TERM GOAL #4   Title Patient to demonstrate L shoulder overhead lift with 5lbs with good scapular mechanics.    Time 8    Period Weeks    Status On-going      PT LONG TERM GOAL #5   Title Patient to report tolerance for driving for 2+ hours without pain in order to return to work.    Time 8    Period Weeks    Status On-going                 Plan - 04/26/20 1353    Clinical Impression Statement Patient reporting some delayed onset muscle soreness in the L shoulder after last session which was mostly dissipated today. Tolerated AAROM with pulleys with good tolerance and fairly good ROM. Patient demonstrated limited supination ROM, thus encouraged patient to continue working on self-stretching at home. Patient tolerated L shoulder and elbow PROM to tolerance and demonstrated some remaining muscle guarding, but improved. Attempted to perform shoulder AAROM, however patient reported pain over the L biceps. Addressed pain with STM and manual TPR to L biceps with good improvement in exercise tolerance. Patient did note difficulty and pain with abduction/scaption AAROM, thus reps were limited into this plane of motion. Ended session with [place] to L shoulder for post-exercise soreness. Patient without complaints at end of session.    Comorbidities HLD, L hand surgery, hx unintentional opiate overdose  x2    Examination-Activity Limitations Sleep;Carry;Dressing;Hygiene/Grooming;Lift;Reach Overhead    Rehab  Potential Good    PT Frequency 2x / week    PT Duration 8 weeks    PT Treatment/Interventions ADLs/Self Care Home Management;Cryotherapy;Electrical Stimulation;Moist Heat;Therapeutic exercise;Therapeutic activities;Functional mobility training;Ultrasound;Neuromuscular re-education;Patient/family education;Manual techniques;Vasopneumatic Device;Taping;Energy conservation;Dry needling;Passive range of motion;Scar mobilization    PT Next Visit Plan Ongoing assessment compliance with precautions, pendulums, shoulder PROM    Consulted and Agree with Plan of Care Patient           Patient will benefit from skilled therapeutic intervention in order to improve the following deficits and impairments:  Hypomobility, Increased edema, Decreased scar mobility, Decreased knowledge of precautions, Decreased activity tolerance, Decreased strength, Pain, Increased muscle spasms, Improper body mechanics, Decreased range of motion, Postural dysfunction, Impaired flexibility  Visit Diagnosis: Acute pain of left shoulder  Stiffness of left shoulder, not elsewhere classified  Stiffness of left elbow, not elsewhere classified  Muscle weakness (generalized)  Abnormal posture     Problem List Patient Active Problem List   Diagnosis Date Noted  . Bilateral shoulder pain 03/21/2017  . Right shoulder pain 05/30/2016  . Left shoulder pain 01/28/2015  . Preop examination 01/28/2015  . Arthritis of left acromioclavicular joint 11/11/2014  . Biceps rupture, proximal 11/11/2014  . Complete tear of left rotator cuff 11/11/2014  . Shoulder impingement 11/11/2014     Anette Guarneri, PT, DPT 04/26/20 2:56 PM   [facility] [address]  [address] [place], [place], [address] Phone: [phone]   Fax:  [phone]  Name: Brandon Hodge MRN: [id number] Date of Birth: 1966/06/08

## 2020-05-02 ENCOUNTER — Ambulatory Visit: Payer: 59 | Admitting: Physical Therapy

## 2020-05-03 ENCOUNTER — Ambulatory Visit: Payer: 59 | Admitting: Physical Therapy

## 2020-05-09 ENCOUNTER — Ambulatory Visit: Payer: 59 | Admitting: Physical Therapy

## 2020-05-11 ENCOUNTER — Ambulatory Visit: Payer: 59

## 2020-05-11 ENCOUNTER — Other Ambulatory Visit: Payer: Self-pay

## 2020-05-11 DIAGNOSIS — M25612 Stiffness of left shoulder, not elsewhere classified: Secondary | ICD-10-CM

## 2020-05-11 DIAGNOSIS — M25512 Pain in left shoulder: Secondary | ICD-10-CM | POA: Diagnosis not present

## 2020-05-11 DIAGNOSIS — M6281 Muscle weakness (generalized): Secondary | ICD-10-CM

## 2020-05-11 DIAGNOSIS — M25622 Stiffness of left elbow, not elsewhere classified: Secondary | ICD-10-CM

## 2020-05-11 DIAGNOSIS — R293 Abnormal posture: Secondary | ICD-10-CM

## 2020-05-11 NOTE — Therapy (Addendum)
[facility] [address]  [place], [place], [address] Phone: [phone]   Fax:  [phone]  Physical Therapy Treatment  Patient Details  Name: Brandon Hodge MRN: [id number] Date of Birth: [birth year] Referring Provider (PT): Ophelia Charter, MD   Encounter Date: 05/11/2020   PT End of Session - 05/11/20 1125    Visit Number 6    Number of Visits 17    Date for PT Re-Evaluation 06/08/20    Authorization Type UHC    PT Start Time 1106    PT Stop Time 1204    PT Time Calculation (min) 58 min    Activity Tolerance Patient tolerated treatment well;Patient limited by pain    Behavior During Therapy [facility] for tasks assessed/performed           Past Medical History:  Diagnosis Date  . Hypercholesteremia   . Rotator cuff (capsule) sprain 2013    Past Surgical History:  Procedure Laterality Date  . HAND SURGERY Left 2012    There were no vitals filed for this visit.   Subjective Assessment - 05/11/20 1112    Subjective Pt. noting L anterior proximal biceps soreness.    Pertinent History HLD, L hand surgery, hx unintentional opiate overdose x2    Diagnostic tests none recent    Patient Stated Goals get back to playing basketball    Currently in Pain? No/denies    Pain Score 3     Pain Location Shoulder    Pain Orientation Left    Pain Descriptors / Indicators Dull;Sore   " shooting" pain occasionally   Pain Type Acute pain;Surgical pain    Multiple Pain Sites No              [facility] PT Assessment - 05/11/20 0001      AROM   AROM Assessment Site Shoulder    Right/Left Shoulder Left    Left Shoulder Flexion 72 Degrees   seated AAROM with cane R assistance - heavy UT compensation    Left Shoulder ABduction 74 Degrees   seated scaption with cane AAROM with heavy R assistance      PROM   PROM Assessment Site Shoulder    Right/Left Shoulder Left    Left Shoulder Flexion 152 Degrees    Left Shoulder  ABduction 100 Degrees    Left Shoulder Internal Rotation 65 Degrees    Left Shoulder External Rotation 46 Degrees                         [facility] Adult PT Treatment/Exercise - 05/11/20 0001      Shoulder Exercises: Supine   External Rotation Left;10 reps;AAROM   heavy cueing required for pillow under elbow and proper moti   External Rotation Limitations wand to tolerance    Flexion Left;10 reps;AAROM    Flexion Limitations wand to tolerance       Shoulder Exercises: Seated   Flexion Left;AAROM;10 reps    Flexion Limitations wand setaed - heavy UT compensation thus terminated after 5 reps       Shoulder Exercises: Pulleys   Flexion 3 minutes    Flexion Limitations to tolerance     Scaption 3 minutes    Scaption Limitations to tolerance       Vasopneumatic   Number Minutes Vasopneumatic  10 minutes    Vasopnuematic Location  Shoulder   L    Vasopneumatic Pressure Low    Vasopneumatic  Temperature  coldest temp.       Manual Therapy   Manual Therapy Passive ROM;Soft tissue mobilization;Myofascial release    Manual therapy comments supine    Soft tissue mobilization STM to L biceps muscle belly    Myofascial Release manual TPR to L biceps muscle belly - improved comfort following     Passive ROM L shoulder PROM with prolonged holds in all directions to pt. tolerance - pain free                     PT Short Term Goals - 05/11/20 1153      PT SHORT TERM GOAL #1   Title Patient to be independent with initial HEP.    Time 3    Period Weeks    Status Achieved    Target Date 05/04/20             PT Long Term Goals - 04/20/20 1500      PT LONG TERM GOAL #1   Title Patient to be independent with advanced HEP.    Time 8    Period Weeks    Status On-going      PT LONG TERM GOAL #2   Title Patient to demonstrate L shoulder and elbow AROM/PROM [facility] w/o limitation d/t pain.    Time 8    Period Weeks    Status On-going      PT LONG TERM GOAL #3    Title Patient to demonstrate L shoulder and elbow strength >/= 4/5 for improved function.    Time 8    Period Weeks    Status On-going      PT LONG TERM GOAL #4   Title Patient to demonstrate L shoulder overhead lift with 5lbs with good scapular mechanics.    Time 8    Period Weeks    Status On-going      PT LONG TERM GOAL #5   Title Patient to report tolerance for driving for 2+ hours without pain in order to return to work.    Time 8    Period Weeks    Status On-going                 Plan - 05/11/20 1128    Clinical Impression Statement Pt. returning to therapy after two weeks absence.  Has cancelled/no showed last few visits with therapy.  Admitting to partial HEP adherence and presenting with guarded/"stiff" L shoulder today which improved ROM with PROM/AAROM activities (see objective measurements).  Did require heavy cueing for proper technique with all supine wand AAROM thus deferred initiation of RTC isometrics today.  Pt. encouraged to get back to consistent/full HEP adherence for most benefit from therapy.  Demonstrating heavy UE compensation with seated AAROM wand elevation activities today thus deferred these activities.  Pt. planning to call and schedule MD f/u for early next week however has not yet scheduled this f/u yet.  Ended visit with ice/compression to L shoulder to reduce post-exercise swelling.    Comorbidities HLD, L hand surgery, hx unintentional opiate overdose x2    Rehab Potential Good    PT Frequency 2x / week    PT Duration 8 weeks    PT Treatment/Interventions ADLs/Self Care Home Management;Cryotherapy;Electrical Stimulation;Moist Heat;Therapeutic exercise;Therapeutic activities;Functional mobility training;Ultrasound;Neuromuscular re-education;Patient/family education;Manual techniques;Vasopneumatic Device;Taping;Energy conservation;Dry needling;Passive range of motion;Scar mobilization    PT Next Visit Plan progress per protocol; assessment of HEP  compliance and update prn    Consulted and Agree  with Plan of Care Patient           Patient will benefit from skilled therapeutic intervention in order to improve the following deficits and impairments:  Hypomobility, Increased edema, Decreased scar mobility, Decreased knowledge of precautions, Decreased activity tolerance, Decreased strength, Pain, Increased muscle spasms, Improper body mechanics, Decreased range of motion, Postural dysfunction, Impaired flexibility  Visit Diagnosis: Acute pain of left shoulder  Stiffness of left shoulder, not elsewhere classified  Stiffness of left elbow, not elsewhere classified  Muscle weakness (generalized)  Abnormal posture     Problem List Patient Active Problem List   Diagnosis Date Noted  . Bilateral shoulder pain 03/21/2017  . Right shoulder pain 05/30/2016  . Left shoulder pain 01/28/2015  . Preop examination 01/28/2015  . Arthritis of left acromioclavicular joint 11/11/2014  . Biceps rupture, proximal 11/11/2014  . Complete tear of left rotator cuff 11/11/2014  . Shoulder impingement 11/11/2014    Bess Harvest, PTA 05/11/20 12:20 PM   [facility] [address]  [place], [place], [address] Phone: [phone]   Fax:  [phone]  Name: Brandon Hodge MRN: [id number] Date of Birth: May 30, 1966   PHYSICAL THERAPY DISCHARGE SUMMARY  Visits from Start of Care: 6  Current functional level related to goals / functional outcomes: See above; patient being D/C'd d/t cancel/no-show policy    Remaining deficits: Decreased shoulder ROM, strength, limited functional activity tolerance    Education / Equipment: HEP  Plan: Patient agrees to discharge.  Patient goals were not met. Patient is being discharged due to meeting the stated rehab goals.  ?????     Janene Harvey, PT, DPT 06/14/20 3:55 PM

## 2020-05-18 ENCOUNTER — Ambulatory Visit: Payer: 59

## 2020-05-25 ENCOUNTER — Ambulatory Visit: Payer: 59

## 2020-05-30 ENCOUNTER — Ambulatory Visit: Payer: 59 | Attending: Orthopaedic Surgery | Admitting: Physical Therapy

## 2021-11-27 ENCOUNTER — Ambulatory Visit: Payer: 59 | Admitting: Family Medicine

## 2021-12-06 ENCOUNTER — Ambulatory Visit (INDEPENDENT_AMBULATORY_CARE_PROVIDER_SITE_OTHER): Payer: 59 | Admitting: Family Medicine

## 2021-12-06 VITALS — BP 126/80 | Ht 71.0 in | Wt 236.0 lb

## 2021-12-06 DIAGNOSIS — M67411 Ganglion, right shoulder: Secondary | ICD-10-CM

## 2021-12-06 NOTE — Patient Instructions (Signed)
You have a ganglion cyst on your right shoulder. ?This is not dangerous and it's recommended to leave it alone. ?If you want this drained we can do this, just give me a call. ?There would be no restrictions after that. ? ?If the knee is bothering you and the conservative treatment hasn't been helping I would go back to discuss replacement with your orthopedist. ? ?

## 2021-12-07 NOTE — Progress Notes (Signed)
PCP: Penni Bombard, PA ? ?Subjective:  ? ?HPI: ?Patient is a 56 y.o. male here for right shoulder swelling. ? ?Patient reports he had left shoulder rotator cuff repair 1.5 years ago. ?This helped him a great deal with his pain but still limited motion and strength. ?Main reason for today's visit though is swelling on superior aspect of right shoulder. ?No pain associated with this but wants to get this checked out to prevent similar issue happening that happened with his left shoulder. ?Noticed first in December, some increase in swelling. ?No bruising or trauma prior to this. ? ?Past Medical History:  ?Diagnosis Date  ? Hypercholesteremia   ? Rotator cuff (capsule) sprain 2013  ? ? ?Current Outpatient Medications on File Prior to Visit  ?Medication Sig Dispense Refill  ? diclofenac (VOLTAREN) 75 MG EC tablet Take 1 tablet (75 mg total) by mouth 2 (two) times daily. (Patient not taking: Reported on 04/13/2020) 60 tablet 1  ? HYDROcodone-acetaminophen (NORCO/VICODIN) 5-325 MG tablet Take 1 tablet by mouth every 6 (six) hours as needed for severe pain. (Patient not taking: Reported on 04/13/2020) 5 tablet 0  ? naproxen (NAPROSYN) 500 MG tablet Take 1 tablet (500 mg total) by mouth 2 (two) times daily with a meal. (Patient not taking: Reported on 04/13/2020) 21 tablet 0  ? ?No current facility-administered medications on file prior to visit.  ? ? ?Past Surgical History:  ?Procedure Laterality Date  ? HAND SURGERY Left 2012  ? ? ?No Known Allergies ? ?BP 126/80   Ht 5\' 11"  (1.803 m)   Wt 236 lb (107 kg)   BMI 32.92 kg/m?  ? ?   ? View : No data to display.  ?  ?  ?  ? ? ?   ? View : No data to display.  ?  ?  ?  ? ? ?    ?Objective:  ?Physical Exam: ? ?Gen: NAD, comfortable in exam room ? ?Right shoulder: ?Focal swelling superior shoulder overlying AC joint, mobile.  No other swelling, ecchymoses.  ?No TTP. ?FROM without pain. ?Negative Hawkins, Neers. ?Negative Yergasons. ?Strength 5/5 with empty can and resisted  internal/external rotation. ?NV intact distally. ? ?Limited msk u/s right shoulder:  Ganglion cyst noted superior shoulder.  No clear communication with AC joint.  No neovascularity. ? ?Assessment & Plan:  ?1. Right shoulder swelling - ganglion cyst confirmed with ultrasound.  Patient without any symptoms related to this.  Discussed conservative treatment vs aspiration.  He is going to think about this.  F/u prn otherwise. ?

## 2022-12-12 ENCOUNTER — Other Ambulatory Visit: Payer: Self-pay

## 2022-12-12 ENCOUNTER — Emergency Department (HOSPITAL_BASED_OUTPATIENT_CLINIC_OR_DEPARTMENT_OTHER): Payer: 59

## 2022-12-12 ENCOUNTER — Emergency Department (HOSPITAL_BASED_OUTPATIENT_CLINIC_OR_DEPARTMENT_OTHER)
Admission: EM | Admit: 2022-12-12 | Discharge: 2022-12-12 | Disposition: A | Discharge status: Home / Self Care | Payer: 59 | Attending: Emergency Medicine | Admitting: Emergency Medicine

## 2022-12-12 DIAGNOSIS — Y92410 Unspecified street and highway as the place of occurrence of the external cause: Secondary | ICD-10-CM | POA: Insufficient documentation

## 2022-12-12 DIAGNOSIS — M25512 Pain in left shoulder: Secondary | ICD-10-CM | POA: Insufficient documentation

## 2022-12-12 DIAGNOSIS — M545 Low back pain, unspecified: Secondary | ICD-10-CM | POA: Insufficient documentation

## 2022-12-12 MED ORDER — LIDOCAINE 5 % EX PTCH: 1.0000 | MEDICATED_PATCH | CUTANEOUS | 0 refills | Status: AC

## 2022-12-12 MED ORDER — LIDOCAINE 5 % EX PTCH
1.0000 | MEDICATED_PATCH | Freq: Once | CUTANEOUS | Status: DC
  Administered 2022-12-12: 1 via TRANSDERMAL
  Filled 2022-12-12: qty 1

## 2022-12-12 MED ORDER — KETOROLAC TROMETHAMINE 15 MG/ML IJ SOLN
15.0000 mg | Freq: Once | INTRAMUSCULAR | Status: AC
  Administered 2022-12-12: 15 mg via INTRAMUSCULAR
  Filled 2022-12-12: qty 1

## 2022-12-12 MED ORDER — METHOCARBAMOL 500 MG PO TABS: 500.0000 mg | ORAL_TABLET | Freq: Two times a day (BID) | ORAL | 0 refills | Status: AC

## 2022-12-12 NOTE — Discharge Instructions (Addendum)
It was a pleasure taking care of you today!  Your imaging in the ED was negative for acute findings of fracture or dislocations. You will feel more sore in the morning. You are prescribed Robaxin (muscle relaxer). Do not drive or operate heavy machinery while taking the muscle relaxer. You may take over the counter 600 mg Ibuprofen every 6 hours or 1,000 mg Tylenol every 6 hours as needed for pain. You may take over the counter 600 mg ibuprofen every 6 hours or 650 mg Tylenol every 6 hours for no more than 7 days. You will be sent a prescription for Robaxin (muscle relaxer), do not drive or operate heavy machinery while taking this medication as it can make you sleepy/drowsy.  You also be sent a prescription for Lidoderm patch, remove and replace with a new patch every 12 hours.  You may apply ice or heat to affected area for up to 15 minutes at a time. Ensure to place a barrier between your skin and the ice/heat. Call your orthopedist for a follow up appointment regarding todays ED visit. Attached is information for the on-call orthopedist to follow up as needed. Return to the Emergency Department if you are experiencing increasing/worsening symptoms.
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
## 2023-01-30 ENCOUNTER — Emergency Department (HOSPITAL_BASED_OUTPATIENT_CLINIC_OR_DEPARTMENT_OTHER): Payer: Self-pay

## 2023-01-30 ENCOUNTER — Emergency Department (HOSPITAL_BASED_OUTPATIENT_CLINIC_OR_DEPARTMENT_OTHER)
Admission: EM | Admit: 2023-01-30 | Discharge: 2023-01-30 | Disposition: A | Discharge status: Home / Self Care | Payer: Self-pay | Attending: Emergency Medicine | Admitting: Emergency Medicine

## 2023-01-30 ENCOUNTER — Other Ambulatory Visit: Payer: Self-pay

## 2023-01-30 DIAGNOSIS — M79671 Pain in right foot: Secondary | ICD-10-CM | POA: Insufficient documentation

## 2023-01-30 NOTE — Discharge Instructions (Signed)
The x-ray of your foot did not show any acute findings.  We discussed supportive treatment, referral to orthopedics has also been placed if you right there is any improvement of your symptoms.
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
## 2023-01-30 NOTE — ED Notes (Signed)
D/c paperwork reviewed with pt, including follow up care.  No questions or concerns voiced at time of d/c. . Pt verbalized understanding, Ambulatory with family to ED exit, NAD.   

## 2023-01-30 NOTE — ED Triage Notes (Signed)
Pt states he stepped on a toy a few weeks ago and has been having right foot pain since then. Hurts to put weight on it.

## 2023-10-20 ENCOUNTER — Emergency Department (HOSPITAL_BASED_OUTPATIENT_CLINIC_OR_DEPARTMENT_OTHER)

## 2023-10-20 ENCOUNTER — Encounter (HOSPITAL_BASED_OUTPATIENT_CLINIC_OR_DEPARTMENT_OTHER): Payer: Self-pay | Admitting: Urology

## 2023-10-20 ENCOUNTER — Other Ambulatory Visit: Payer: Self-pay

## 2023-10-20 ENCOUNTER — Emergency Department (HOSPITAL_BASED_OUTPATIENT_CLINIC_OR_DEPARTMENT_OTHER)
Admission: EM | Admit: 2023-10-20 | Discharge: 2023-10-20 | Disposition: A | Discharge status: Home / Self Care | Attending: Emergency Medicine | Admitting: Emergency Medicine

## 2023-10-20 DIAGNOSIS — Z79899 Other long term (current) drug therapy: Secondary | ICD-10-CM | POA: Insufficient documentation

## 2023-10-20 DIAGNOSIS — M25511 Pain in right shoulder: Secondary | ICD-10-CM | POA: Insufficient documentation

## 2023-10-20 DIAGNOSIS — M79671 Pain in right foot: Secondary | ICD-10-CM | POA: Diagnosis not present

## 2023-10-20 HISTORY — DX: Unspecified osteoarthritis, unspecified site: M19.90

## 2023-10-20 MED ORDER — OXYCODONE HCL 5 MG PO TABS: 5.0000 mg | ORAL_TABLET | Freq: Four times a day (QID) | ORAL | 0 refills | Status: AC | PRN

## 2023-10-20 MED ORDER — HYDROCODONE-ACETAMINOPHEN 5-325 MG PO TABS
1.0000 | ORAL_TABLET | Freq: Once | ORAL | Status: AC
  Administered 2023-10-20: 1 via ORAL
  Filled 2023-10-20: qty 1

## 2023-10-20 NOTE — ED Notes (Signed)
 Patient transported to X-ray

## 2023-10-20 NOTE — ED Provider Notes (Signed)
 [place] [facility] Provider Note   CSN: [id number] Arrival date & time: 10/20/23  1717     History  Chief Complaint  Patient presents with   Shoulder Pain   Foot Injury    Brandon Hodge is a 58 y.o. male.  HPI   58 year old male presents emergency department for right shoulder and right foot pain.  He states that he was stepping back off a ladder when he landed hard on the right foot and felt like he strained his right shoulder.  This happened about 3 days ago.  The pain is ongoing and not relieved with over-the-counter medications.  Home Medications Prior to Admission medications   Medication Sig Start Date End Date Taking? Authorizing Provider  oxyCODONE (ROXICODONE) 5 MG immediate release tablet Take 1 tablet (5 mg total) by mouth every 6 (six) hours as needed for severe pain (pain score 7-10). 10/20/23  Yes Worthy Boschert, Clabe Seal, DO  diclofenac (VOLTAREN) 75 MG EC tablet Take 1 tablet (75 mg total) by mouth 2 (two) times daily. Patient not taking: Reported on 04/13/2020 05/19/18   Lenda Kelp, MD  HYDROcodone-acetaminophen (NORCO/VICODIN) 5-325 MG tablet Take 1 tablet by mouth every 6 (six) hours as needed for severe pain. Patient not taking: Reported on 04/13/2020 05/10/18   Zadie Rhine, MD  lidocaine (LIDODERM) 5 % Place 1 patch onto the skin daily. Remove & Discard patch within 12 hours or as directed by MD 12/12/22   Blue, Soijett A, PA-C  methocarbamol (ROBAXIN) 500 MG tablet Take 1 tablet (500 mg total) by mouth 2 (two) times daily. 12/12/22   Blue, Soijett A, PA-C  naproxen (NAPROSYN) 500 MG tablet Take 1 tablet (500 mg total) by mouth 2 (two) times daily with a meal. Patient not taking: Reported on 04/13/2020 08/03/19   Caccavale, Sophia, PA-C      Allergies    Patient has no known allergies.    Review of Systems   Review of Systems  Constitutional:  Negative for fever.  Respiratory:  Negative for shortness of breath.    Cardiovascular:  Negative for chest pain.  Musculoskeletal:        + Right shoulder and right foot pain  Neurological:  Negative for weakness and numbness.    Physical Exam Updated Vital Signs BP 118/76 (BP Location: Left Arm)   Pulse 92   Temp 98.4 F (36.9 C)   Resp 20   Ht 5\' 11"  (1.803 m)   Wt 107 kg   SpO2 100%   BMI 32.90 kg/m  Physical Exam Vitals and nursing note reviewed.  Constitutional:      Appearance: Normal appearance.  HENT:     Head: Normocephalic.     Mouth/Throat:     Mouth: Mucous membranes are moist.  Cardiovascular:     Rate and Rhythm: Normal rate.  Pulmonary:     Effort: Pulmonary effort is normal. No respiratory distress.  Musculoskeletal:     Comments: Tenderness to palpation of the right AC joint with pain with movement locating to the rotator cuff.  Otherwise arm is neurovascularly intact.  There is tenderness to palpation just below the ankle on the right foot, it is neurovascularly intact without any palpated deformities.  Skin:    General: Skin is warm.  Neurological:     Mental Status: He is alert and oriented to person, place, and time. Mental status is at baseline.  Psychiatric:        Mood and  Affect: Mood normal.     ED Results / Procedures / Treatments   Labs (all labs ordered are listed, but only abnormal results are displayed) Labs Reviewed - No data to display  EKG None  Radiology DG Foot Complete Right Result Date: 10/20/2023 CLINICAL DATA:  Acute right foot pain after injury yesterday. EXAM: RIGHT FOOT COMPLETE - 3+ VIEW COMPARISON:  January 30, 2023. FINDINGS: There is no evidence of fracture or dislocation. Mild posterior calcaneal spurring is noted. Mild degenerative changes are seen involving multiple tarsometatarsal joints. Soft tissues are unremarkable. IMPRESSION: No acute abnormality seen. Electronically Signed   By: Lupita Raider M.D.   On: 10/20/2023 18:30   DG Shoulder Right Result Date: 10/20/2023 CLINICAL DATA:   Chronic right shoulder pain without known injury. EXAM: RIGHT SHOULDER - 2+ VIEW COMPARISON:  July 09, 2016. FINDINGS: There is no evidence of fracture or dislocation. Mild degenerative changes seen involving right acromioclavicular and glenohumeral joints. Mild acromial spurring is noted. Soft tissues are unremarkable. IMPRESSION: Mild degenerative changes as noted above. No acute abnormality seen. Electronically Signed   By: Lupita Raider M.D.   On: 10/20/2023 18:24    Procedures Procedures    Medications Ordered in ED Medications  HYDROcodone-acetaminophen (NORCO/VICODIN) 5-325 MG per tablet 1 tablet (has no administration in time range)    ED Course/ Medical Decision Making/ A&P                                 Medical Decision Making Amount and/or Complexity of Data Reviewed Radiology: ordered.  Risk Prescription drug management.   58 year old male presents emergency department with right shoulder and right foot pain after stepping off a ladder abruptly.  Patient is tender along ligaments with palpation but otherwise no obvious deformities.  X-ray imaging is negative.  Will treat conservatively and refer to orthopedics.  Patient at this time appears safe and stable for discharge and close outpatient follow up. Discharge plan and strict return to ED precautions discussed, patient verbalizes understanding and agreement.        Final Clinical Impression(s) / ED Diagnoses Final diagnoses:  Acute pain of right shoulder  Right foot pain    Rx / DC Orders ED Discharge Orders          Ordered    oxyCODONE (ROXICODONE) 5 MG immediate release tablet  Every 6 hours PRN        10/20/23 1936              Rozelle Logan, DO 10/20/23 1938

## 2023-10-20 NOTE — Discharge Instructions (Signed)
 You have been seen and discharged from the emergency department.  Your x-ray showed no fracture but I believe you are dealing with a sprained ligament, ligamental pain.  You may use the shoulder sling for relief but otherwise you may keep the shoulder immobile for daily activities.  Anytime you are sitting you may elevate and ice the foot.  Follow-up with orthopedics for further evaluation and treatment.  You may take Tylenol ibuprofen as needed for pain control.  Take stronger pain medicine as needed.  Do not mix this medication with alcohol or other sedating medications. Do not drive or do heavy physical activity until you know how this medication affects you.  It may cause drowsiness.  Follow-up with your primary provider for further evaluation and further care. Take home medications as prescribed. If you have any worsening symptoms or further concerns for your health please return to an emergency department for further evaluation.

## 2023-10-20 NOTE — ED Triage Notes (Signed)
 Pt  right foot pain after stepping off ladder wrong yesterday  States right shoulder pain as well x 3 days  States history of chronic shoulder pain, had sx on right shoulder and left shoulder is now hurting  Denies any injury   Dx with RA

## 2024-01-29 ENCOUNTER — Other Ambulatory Visit: Payer: Self-pay

## 2024-01-29 ENCOUNTER — Encounter (HOSPITAL_BASED_OUTPATIENT_CLINIC_OR_DEPARTMENT_OTHER): Payer: Self-pay | Admitting: Emergency Medicine

## 2024-01-29 ENCOUNTER — Emergency Department (HOSPITAL_BASED_OUTPATIENT_CLINIC_OR_DEPARTMENT_OTHER)
Admission: EM | Admit: 2024-01-29 | Discharge: 2024-01-30 | Disposition: A | Discharge status: Home / Self Care | Attending: Emergency Medicine | Admitting: Emergency Medicine

## 2024-01-29 DIAGNOSIS — S46811A Strain of other muscles, fascia and tendons at shoulder and upper arm level, right arm, initial encounter: Secondary | ICD-10-CM | POA: Insufficient documentation

## 2024-01-29 DIAGNOSIS — X58XXXA Exposure to other specified factors, initial encounter: Secondary | ICD-10-CM | POA: Diagnosis not present

## 2024-01-29 DIAGNOSIS — M25511 Pain in right shoulder: Secondary | ICD-10-CM | POA: Diagnosis present

## 2024-01-29 MED ORDER — DICLOFENAC SODIUM 1 % EX GEL: 4.0000 g | Freq: Four times a day (QID) | CUTANEOUS | 0 refills | Status: AC

## 2024-01-29 MED ORDER — METHYLPREDNISOLONE 4 MG PO TBPK: ORAL_TABLET | ORAL | 0 refills | Status: AC

## 2024-01-29 MED ORDER — OXYCODONE HCL 5 MG PO TABS
5.0000 mg | ORAL_TABLET | Freq: Once | ORAL | Status: AC
  Administered 2024-01-29: 5 mg via ORAL
  Filled 2024-01-29: qty 1

## 2024-01-29 MED ORDER — ACETAMINOPHEN 500 MG PO TABS
1000.0000 mg | ORAL_TABLET | Freq: Once | ORAL | Status: AC
  Administered 2024-01-29: 1000 mg via ORAL
  Filled 2024-01-29: qty 2

## 2024-01-29 MED ORDER — KETOROLAC TROMETHAMINE 15 MG/ML IJ SOLN
15.0000 mg | Freq: Once | INTRAMUSCULAR | Status: AC
  Administered 2024-01-29: 15 mg via INTRAMUSCULAR
  Filled 2024-01-29: qty 1

## 2024-01-29 NOTE — Discharge Instructions (Addendum)
 Follow up with your doctor in the office.   Take the steroids as prescribed.  Use the gel as prescribed. Also take tylenol  1000mg (2 extra strength) four times a day.

## 2024-01-29 NOTE — ED Triage Notes (Signed)
 Pt POV c/o R shoulder pain since earlier today.  Hx of arthritis in same area.

## 2024-01-30 NOTE — ED Provider Notes (Signed)
 [place] [facility] Provider Note   CSN: [id number] Arrival date & time: 01/29/24  2246     Patient presents with: Shoulder Pain   Brandon Hodge is a 58 y.o. male.   58 yo M with a chief complaint of right shoulder pain.  This has been going on for a couple days.  The patient unfortunately is struggled with pain to the right shoulder off and on.  He thinks is due to compensating for chronic pain to the left shoulder.  He is status post rotator cuff repair and has limited range of motion with the left arm.  He denies trauma.  Feels like the pain comes from his neck down to the posterior aspect of the shoulder and somewhat down the back.  Worse with movement twisting palpation.   Shoulder Pain      Prior to Admission medications   Medication Sig Start Date End Date Taking? Authorizing Provider  diclofenac  Sodium (VOLTAREN ) 1 % GEL Apply 4 g topically 4 (four) times daily. 01/29/24  Yes Albertus Hughs, DO  methylPREDNISolone  (MEDROL  DOSEPAK) 4 MG TBPK tablet Day 1: 8mg  before breakfast, 4 mg after lunch, 4 mg after supper, and 8 mg at bedtime Day 2: 4 mg before breakfast, 4 mg after lunch, 4 mg  after supper, and 8 mg  at bedtime Day 3:  4 mg  before breakfast, 4 mg  after lunch, 4 mg after supper, and 4 mg  at bedtime Day 4: 4 mg  before breakfast, 4 mg  after lunch, and 4 mg at bedtime Day 5: 4 mg  before breakfast and 4 mg at bedtime Day 6: 4 mg  before breakfast 01/29/24  Yes Tuff Clabo, DO  diclofenac  (VOLTAREN ) 75 MG EC tablet Take 1 tablet (75 mg total) by mouth 2 (two) times daily. Patient not taking: Reported on 04/13/2020 05/19/18   Salina Craver, MD  HYDROcodone -acetaminophen  (NORCO/VICODIN) 5-325 MG tablet Take 1 tablet by mouth every 6 (six) hours as needed for severe pain. Patient not taking: Reported on 04/13/2020 05/10/18   Eldon Greenland, MD  lidocaine  (LIDODERM ) 5 % Place 1 patch onto the skin daily. Remove & Discard patch within 12  hours or as directed by MD 12/12/22   Blue, Soijett A, PA-C  methocarbamol  (ROBAXIN ) 500 MG tablet Take 1 tablet (500 mg total) by mouth 2 (two) times daily. 12/12/22   Blue, Soijett A, PA-C  naproxen  (NAPROSYN ) 500 MG tablet Take 1 tablet (500 mg total) by mouth 2 (two) times daily with a meal. Patient not taking: Reported on 04/13/2020 08/03/19   Caccavale, Sophia, PA-C  oxyCODONE  (ROXICODONE ) 5 MG immediate release tablet Take 1 tablet (5 mg total) by mouth every 6 (six) hours as needed for severe pain (pain score 7-10). 10/20/23   Horton, Sidra Dredge, DO    Allergies: Patient has no known allergies.    Review of Systems  Updated Vital Signs BP 128/86 (BP Location: Left Arm)   Pulse 63   Temp 97.8 F (36.6 C) (Oral)   Resp 18   Ht 5' 11 (1.803 m)   SpO2 99%   BMI 32.90 kg/m   Physical Exam Vitals and nursing note reviewed.  Constitutional:      Appearance: He is well-developed.  HENT:     Head: Normocephalic and atraumatic.   Eyes:     Pupils: Pupils are equal, round, and reactive to light.   Neck:     Vascular: No JVD.  Cardiovascular:     Rate and Rhythm: Normal rate and regular rhythm.     Heart sounds: No murmur heard.    No friction rub. No gallop.  Pulmonary:     Effort: No respiratory distress.     Breath sounds: No wheezing.  Abdominal:     General: There is no distension.     Tenderness: There is no abdominal tenderness. There is no guarding or rebound.   Musculoskeletal:        General: Normal range of motion.     Cervical back: Normal range of motion and neck supple.     Comments: No obvious pain along the clavicle AC joint or the biceps tendon groove.  Pulse motor and sensation are intact distally.  Patient does have pain along the muscle belly of the right trapezius.  Reproduces his discomfort.   Skin:    Coloration: Skin is not pale.     Findings: No rash.   Neurological:     Mental Status: He is alert and oriented to person, place, and time.    Psychiatric:        Behavior: Behavior normal.     (all labs ordered are listed, but only abnormal results are displayed) Labs Reviewed - No data to display  EKG: None  Radiology: No results found.   Procedures   Medications Ordered in the ED  acetaminophen  (TYLENOL ) tablet 1,000 mg (1,000 mg Oral Given 01/29/24 2356)  ketorolac  (TORADOL ) 15 MG/ML injection 15 mg (15 mg Intramuscular Given 01/29/24 2356)  oxyCODONE  (Oxy IR/ROXICODONE ) immediate release tablet 5 mg (5 mg Oral Given 01/29/24 2356)                                    Medical Decision Making Risk OTC drugs. Prescription drug management.   58 yo M with a chief complaint of right shoulder pain.  This is a recurrent issue for him.  Most likely trapezius muscle spasm on my exam.   As this is atraumatic I think it would be unlikely be helpful to obtain an x-ray.  On record review the patient actually had an x-ray done about 3 months ago independently interpreted by me without obvious fracture or dislocation.   Will treat supportively.  He tells me he has a history of rheumatoid arthritis and is not on any medications.  Trial Medrol  Dosepak.  PCP follow-up.  12:27 AM:  I have discussed the diagnosis/risks/treatment options with the patient and family.  Evaluation and diagnostic testing in the emergency department does not suggest an emergent condition requiring admission or immediate intervention beyond what has been performed at this time.  They will follow up with PCP. We also discussed returning to the ED immediately if new or worsening sx occur. We discussed the sx which are most concerning (e.g., sudden worsening pain, fever, inability to tolerate by mouth) that necessitate immediate return. Medications administered to the patient during their visit and any new prescriptions provided to the patient are listed below.  Medications given during this visit Medications  acetaminophen  (TYLENOL ) tablet 1,000 mg (1,000  mg Oral Given 01/29/24 2356)  ketorolac  (TORADOL ) 15 MG/ML injection 15 mg (15 mg Intramuscular Given 01/29/24 2356)  oxyCODONE  (Oxy IR/ROXICODONE ) immediate release tablet 5 mg (5 mg Oral Given 01/29/24 2356)     The patient appears reasonably screen and/or stabilized for discharge and I doubt any other medical condition or other [facility]  requiring further screening, evaluation, or treatment in the ED at this time prior to discharge.        Final diagnoses:  Strain of right trapezius muscle, initial encounter    ED Discharge Orders          Ordered    methylPREDNISolone  (MEDROL  DOSEPAK) 4 MG TBPK tablet        01/29/24 2348    diclofenac  Sodium (VOLTAREN ) 1 % GEL  4 times daily        01/29/24 2349               Albertus Hughs, DO 01/30/24 0028

## 2024-03-11 NOTE — Progress Notes (Signed)
 Assessment/Plan   1. Encounter to establish care (Primary) 2. Pain in joint, multiple sites 3. Other chronic pain 4. Positive urine drug screen 5. Chronic gout without tophus, unspecified cause, unspecified site  Will check RA labs and uric acid Refer to [facility] Pain management   Patient expresses understanding of their current medications and use.  If a new prescription was given today, then I discussed potential side effects, drug interactions, instructions for taking the medication, and the consequences of not taking it.   Patient verbalized an understanding of these instructions.  Patient is able to verbalize understanding of the care plan discussed today.   Follow up:  Call sooner if needed.  Subjective   HPI:  Brandon Hodge is a 58 y.o. male who  presents to the office today for: New patient to establish care.  Was seeing provider at [facility] Has chronic pain of both shoulders, lower back, BL knees. Has had prior shoulder surgery. He finds that the only thing that helps is oxycodone .   Review of record shows 2x ED visit in 2021 for possible drug overdose from tramadol  for joint pain. UDS positive for cocaine and THC at the time. He admits to [facility] use for pain as well. He tells me that he has Rheumatoid arthritis based on MRI but not sure that that is the case.Also mentions Hx of gout in BL feet and knees. Not following gout diet Has alcohol  on the weekends    Review of Systems:   Per HPI above, otherwise negative   The following portions of the patient's history were reviewed and updated as appropriate: allergies, current medications, past family history, past medical history, past social history, past surgical history and problem list.  Medical History[1]   Medications:  Current Medications[2]   Allergies[3]   Social History   Socioeconomic History  . Marital status: Married    Spouse name: Not on file  . Number of children: Not on file  .  Years of education: Not on file  . Highest education level: Not on file  Occupational History  . Not on file  Tobacco Use  . Smoking status: Former  . Smokeless tobacco: Never  Substance and Sexual Activity  . Alcohol use: Yes  . Drug use: Not on file  . Sexual activity: Not on file  Other Topics Concern  . Not on file  Social History Narrative  . Not on file   Social Drivers of Health   Food Insecurity: Not on file  Transportation Needs: Not on file  Safety: Not on file  Living Situation: Not on file     Surgical History[4]      Objective   Vitals:   03/11/24 1112  BP: 123/78  Pulse: 63  Temp: 97.7 F (36.5 C)  SpO2: 98%    Physical Exam:   Physical Exam Vitals reviewed.   Eyes:     Conjunctiva/sclera: Conjunctivae normal.   Pulmonary:     Effort: Pulmonary effort is normal.   Skin:    General: Skin is warm and dry.   Neurological:     Mental Status: He is alert and oriented to person, place, and time.   Psychiatric:        Mood and Affect: Mood normal.        Behavior: Behavior normal.       Relevant portions of the electronic records were reviewed. Recent and/or relevant labs and imaging are listed below.    Recent Labs:  CrCl cannot be calculated (Patient's most recent lab result is older than the maximum 10 days allowed.).   Lab Results  Component Value Date   WBC 12.8 (H) 01/12/2020   HGB 15.6 01/12/2020   HCT 47.5 01/12/2020   PLT 205 01/12/2020   NA 136 01/12/2020   K 4.5 01/12/2020   CL 97 (L) 01/12/2020   CREATININE 1.54 (H) 01/12/2020   BUN 16 01/12/2020   CO2 28 01/12/2020   GLUCOSE 220 (H) 01/12/2020       Recent Imaging:    XR KNEE LT 1 OR 2 VIEWS CLINICAL DATA:  Left knee pain and swelling for the past 3 weeks. No known trauma.  EXAM: LEFT KNEE - 1-2 VIEW  COMPARISON:  None.  FINDINGS: No acute fracture or dislocation. 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 Mild-to-moderate tricompartmental degenerative change of  the knee with joint space loss, subchondral sclerosis and osteophytosis. There is minimal spurring of the tibial spines. No evidence of chondrocalcinosis. Minimal enthesopathic change about the tibial tuberosity. Small knee joint effusion. Regional soft tissues appear normal.  IMPRESSION: 1. Small knee joint effusion.  Otherwise, no acute findings. 2. Mild-to-moderate tricompartmental degenerative change of the knee.  Electronically Signed   By: Norleen Roulette M.D.   On: 08/16/2019 14:20     Signature   I agree that the documentation is accurate and complete.  Electronically signed by: Franky Ozell Molt, PA-C 03/11/2024 11:35 AM     This document serves as a record of services personally performed by Franky Molt, PA-C.  It was created on their behalf by Mitzie LITTIE Amabile, CMA, a trained medical scribe, and Certified Medical Assistant (CMA). During the course of documenting the history, physical exam and medical decision making, I was functioning as a Stage manager. The creation of this record is the provider's dictation and/or activities during the visit.  Electronically signed by Mitzie LITTIE Amabile, CMA 03/11/2024 11:05 AM        [1] Past Medical History: Diagnosis Date  . Gout   . Prediabetes   . Rheumatoid arthritis    (CMD)   [2]  Current Outpatient Medications:  .  oxyCODONE  (ROXICODONE ) 5 mg immediate release tablet, Take 5 mg by mouth., Disp: , Rfl:  .  tadalafiL (CIALIS) 20 mg tablet, Take 1 tablet by mouth daily., Disp: , Rfl:  .  diclofenac  sodium (VOLTAREN ) 1 % gel, Apply 2 g topically 4 (four) times a day as needed., Disp: 100 g, Rfl: 1 .  naloxone 1 mg/mL injection, For suspected opioid overdose, spray 1 mL in each nostril.  Repeat after 3 minutes if no or minimal response. (Patient not taking: Reported on 03/11/2024), Disp: 4 mL, Rfl: 0 [3] No Known Allergies [4] Past Surgical History: Procedure Laterality Date  . HAND SURGERY Left   . SHOULDER SURGERY Left

## 2024-05-06 ENCOUNTER — Other Ambulatory Visit: Payer: Self-pay

## 2024-05-06 ENCOUNTER — Emergency Department (HOSPITAL_BASED_OUTPATIENT_CLINIC_OR_DEPARTMENT_OTHER)

## 2024-05-06 ENCOUNTER — Emergency Department (HOSPITAL_BASED_OUTPATIENT_CLINIC_OR_DEPARTMENT_OTHER)
Admission: EM | Admit: 2024-05-06 | Discharge: 2024-05-06 | Disposition: A | Discharge status: Home / Self Care | Attending: Emergency Medicine | Admitting: Emergency Medicine

## 2024-05-06 ENCOUNTER — Encounter (HOSPITAL_BASED_OUTPATIENT_CLINIC_OR_DEPARTMENT_OTHER): Payer: Self-pay | Admitting: Emergency Medicine

## 2024-05-06 DIAGNOSIS — R519 Headache, unspecified: Secondary | ICD-10-CM | POA: Insufficient documentation

## 2024-05-06 DIAGNOSIS — I10 Essential (primary) hypertension: Secondary | ICD-10-CM | POA: Insufficient documentation

## 2024-05-06 DIAGNOSIS — M542 Cervicalgia: Secondary | ICD-10-CM | POA: Diagnosis not present

## 2024-05-06 LAB — CBC
HCT: 42.6 % (ref 39.0–52.0)
Hemoglobin: 14.3 g/dL (ref 13.0–17.0)
MCH: 28.3 pg (ref 26.0–34.0)
MCHC: 33.6 g/dL (ref 30.0–36.0)
MCV: 84.2 fL (ref 80.0–100.0)
Platelets: 176 K/uL (ref 150–400)
RBC: 5.06 MIL/uL (ref 4.22–5.81)
RDW: 13.5 % (ref 11.5–15.5)
WBC: 5 K/uL (ref 4.0–10.5)
nRBC: 0 % (ref 0.0–0.2)

## 2024-05-06 LAB — BASIC METABOLIC PANEL WITH GFR
Anion gap: 12 (ref 5–15)
BUN: 8 mg/dL (ref 6–20)
CO2: 23 mmol/L (ref 22–32)
Calcium: 9 mg/dL (ref 8.9–10.3)
Chloride: 104 mmol/L (ref 98–111)
Creatinine, Ser: 1.18 mg/dL (ref 0.61–1.24)
GFR, Estimated: >60 mL/min (ref >60)
Glucose, Bld: 93 mg/dL (ref 70–99)
Potassium: 3.6 mmol/L (ref 3.5–5.1)
Sodium: 139 mmol/L (ref 135–145)

## 2024-05-06 MED ORDER — IOHEXOL 350 MG/ML SOLN
60.0000 mL | Freq: Once | INTRAVENOUS | Status: AC | PRN
  Administered 2024-05-06: 60 mL via INTRAVENOUS

## 2024-05-06 MED ORDER — NAPROXEN 500 MG PO TABS: 500.0000 mg | ORAL_TABLET | Freq: Two times a day (BID) | ORAL | 0 refills | Status: AC

## 2024-05-06 MED ORDER — KETOROLAC TROMETHAMINE 15 MG/ML IJ SOLN
15.0000 mg | Freq: Once | INTRAMUSCULAR | Status: DC
  Filled 2024-05-06: qty 1

## 2024-05-06 MED ORDER — KETOROLAC TROMETHAMINE 15 MG/ML IJ SOLN: 15.0000 mg | Freq: Once | INTRAMUSCULAR | Status: DC

## 2024-05-06 MED ORDER — ACETAMINOPHEN 500 MG PO TABS
1000.0000 mg | ORAL_TABLET | Freq: Once | ORAL | Status: AC
  Administered 2024-05-06: 1000 mg via ORAL
  Filled 2024-05-06: qty 2

## 2024-05-06 MED ORDER — KETOROLAC TROMETHAMINE 15 MG/ML IJ SOLN
15.0000 mg | Freq: Once | INTRAMUSCULAR | Status: AC
  Administered 2024-05-06: 15 mg via INTRAVENOUS

## 2024-05-06 NOTE — ED Triage Notes (Signed)
 Pt c/o posterior HA since yesterday; no injury; denies other sxs

## 2024-05-06 NOTE — Discharge Instructions (Addendum)
 CT scan and labs reassuring today.  It is recommended to take naproxen  2 times daily as needed for pain.  Monitor for worsening symptoms including shortness of breath, fever, chest pain, weakness, visual loss, fainting, or dizziness.  If these symptoms or other concerning symptoms occur return for further evaluation.  If symptoms do not resolve follow-up with PCP for further management evaluation.

## 2024-05-06 NOTE — ED Provider Notes (Signed)
 [facility] [place] [facility] Provider Note   CSN: [id number] Arrival date & time: 05/06/24  1126     Patient presents with: Headache   Brandon Hodge is a 58 y.o. male.  58 y/o male presents to ED with complaints of headache x 2 days.  Patient advises he was walking when he took a hard step and felt a jolt in the back of his head and immediately had a headache that he reported was 15 out of 10 pain.  Patient reports he does not typically get headaches and it was concerning for him.  He did not seek medical attention at the time and took a hydrocodone  at home which is he is prescribed for arthritis.  He reports today he still has a 6 out of 10 headache but has decreased tremendously.  He does endorse pain with neck range of motion.  Patient denies any nausea, vomiting, fevers, visual disturbances, photophobia, history of migraines.  Patient denies history of hypertension or blood thinners.  Patient does endorse high cholesterol.     Prior to Admission medications   Medication Sig Start Date End Date Taking? Authorizing Provider  naproxen  (NAPROSYN ) 500 MG tablet Take 1 tablet (500 mg total) by mouth 2 (two) times daily. 05/06/24  Yes Myriam Fonda RAMAN, PA-C  diclofenac  (VOLTAREN ) 75 MG EC tablet Take 1 tablet (75 mg total) by mouth 2 (two) times daily. Patient not taking: Reported on 04/13/2020 05/19/18   Cleatrice Ludie SAUNDERS, MD  diclofenac  Sodium (VOLTAREN ) 1 % GEL Apply 4 g topically 4 (four) times daily. 01/29/24   Floyd, Dan, DO  HYDROcodone -acetaminophen  (NORCO/VICODIN) 5-325 MG tablet Take 1 tablet by mouth every 6 (six) hours as needed for severe pain. Patient not taking: Reported on 04/13/2020 05/10/18   Midge Golas, MD  lidocaine  (LIDODERM ) 5 % Place 1 patch onto the skin daily. Remove & Discard patch within 12 hours or as directed by MD 12/12/22   Blue, Soijett A, PA-C  methocarbamol  (ROBAXIN ) 500 MG tablet Take 1 tablet (500 mg total) by mouth 2 (two) times  daily. 12/12/22   Blue, Soijett A, PA-C  methylPREDNISolone  (MEDROL  DOSEPAK) 4 MG TBPK tablet Day 1: 8mg  before breakfast, 4 mg after lunch, 4 mg after supper, and 8 mg at bedtime Day 2: 4 mg before breakfast, 4 mg after lunch, 4 mg  after supper, and 8 mg  at bedtime Day 3:  4 mg  before breakfast, 4 mg  after lunch, 4 mg after supper, and 4 mg  at bedtime Day 4: 4 mg  before breakfast, 4 mg  after lunch, and 4 mg at bedtime Day 5: 4 mg  before breakfast and 4 mg at bedtime Day 6: 4 mg  before breakfast 01/29/24   Floyd, Dan, DO  oxyCODONE  (ROXICODONE ) 5 MG immediate release tablet Take 1 tablet (5 mg total) by mouth every 6 (six) hours as needed for severe pain (pain score 7-10). 10/20/23   Horton, Kristie M, DO    Allergies: Patient has no known allergies.    Review of Systems  Neurological:  Positive for headaches.  All other systems reviewed and are negative.   Updated Vital Signs BP 127/89   Pulse 72   Temp 97.7 F (36.5 C) (Oral)   Resp 15   Ht 6' (1.829 m)   Wt 97.5 kg   SpO2 100%   BMI 29.16 kg/m   Physical Exam Vitals and nursing note reviewed.  Constitutional:  General: He is not in acute distress.    Appearance: Normal appearance. He is well-developed. He is not ill-appearing.  HENT:     Head: Normocephalic and atraumatic.     Nose: Nose normal.  Eyes:     General: No visual field deficit.    Extraocular Movements: Extraocular movements intact.     Right eye: Normal extraocular motion and no nystagmus.     Left eye: Normal extraocular motion and no nystagmus.     Conjunctiva/sclera: Conjunctivae normal.     Pupils: Pupils are equal, round, and reactive to light.  Cardiovascular:     Rate and Rhythm: Normal rate.  Pulmonary:     Effort: Pulmonary effort is normal. No respiratory distress.     Breath sounds: Normal breath sounds.  Abdominal:     Palpations: Abdomen is soft.     Tenderness: There is no abdominal tenderness.  Musculoskeletal:        General:  Normal range of motion.     Cervical back: Normal range of motion.  Skin:    General: Skin is warm.  Neurological:     General: No focal deficit present.     Mental Status: He is alert and oriented to person, place, and time.     GCS: GCS eye subscore is 4. GCS verbal subscore is 5. GCS motor subscore is 6.     Cranial Nerves: No cranial nerve deficit or facial asymmetry.     Sensory: No sensory deficit.     Motor: No weakness.  Psychiatric:        Mood and Affect: Mood normal.        Behavior: Behavior normal.     (all labs ordered are listed, but only abnormal results are displayed) Labs Reviewed  BASIC METABOLIC PANEL WITH GFR  CBC    EKG: None  Radiology: CT ANGIO HEAD NECK W WO CM Result Date: 05/06/2024 CLINICAL DATA:  Provided history: Severe headache, sudden onset. EXAM: CT ANGIOGRAPHY HEAD AND NECK WITH AND WITHOUT CONTRAST TECHNIQUE: Multidetector CT imaging of the head and neck was performed using the standard protocol during bolus administration of intravenous contrast. Multiplanar CT image reconstructions and MIPs were obtained to evaluate the vascular anatomy. Carotid stenosis measurements (when applicable) are obtained utilizing NASCET criteria, using the distal internal carotid diameter as the denominator. RADIATION DOSE REDUCTION: This exam was performed according to the departmental dose-optimization program which includes automated exposure control, adjustment of the mA and/or kV according to patient size and/or use of iterative reconstruction technique. CONTRAST:  60mL OMNIPAQUE  IOHEXOL  350 MG/ML SOLN COMPARISON:  None. FINDINGS: CT HEAD FINDINGS Brain: Cerebral volume is normal. There is no acute intracranial hemorrhage. No demarcated cortical infarct. No extra-axial fluid collection. No evidence of an intracranial mass. No midline shift. Vascular: No hyperdense vessel. Skull: No calvarial fracture or aggressive osseous lesion. Sinuses/Orbits: No orbital mass or acute  orbital finding. Minimal mucosal thickening within the bilateral maxillary, right sphenoid and bilateral frontal sinuses. Review of the MIP images confirms the above findings CTA NECK FINDINGS Aortic arch: Standard aortic branching. Minimal atherosclerotic plaque within the aortic arch. Streak/beam hardening artifact arising from a dense contrast bolus partially obscures the left subclavian artery. Within this limitation, there is no appreciable hemodynamically significant innominate or proximal subclavian artery stenosis. Right carotid system: CCA and ICA patent within the neck without stenosis or significant atherosclerotic disease Left carotid system: CCA and ICA patent within the neck without stenosis or significant atherosclerotic disease. Vertebral arteries:  Left vertebral artery is non dominant and developmentally diminutive, but patent throughout the neck. The right vertebral artery is dominant and patent within the neck without stenosis or significant atherosclerotic disease. Skeleton: The patient is edentulous. Cervical spondylosis. No acute fracture or aggressive osseous lesion. Other neck: Subcentimeter nodule within the right thyroid lobe not meeting consensus criteria for ultrasound follow-up based on size. No follow-up imaging recommended. Reference: J Am Coll Radiol. 2015 Feb;12(2): 143-50. Upper chest: No consolidation within the imaged lung apices. Review of the MIP images confirms the above findings CTA HEAD FINDINGS Anterior circulation: The intracranial internal carotid arteries are patent. The M1 middle cerebral arteries are patent. Asymmetrically early branching of the right middle cerebral artery M1 segment. No M2 proximal branch occlusion or high-grade proximal stenosis. The anterior cerebral arteries are patent. No intracranial aneurysm is identified. Posterior circulation: The intracranial vertebral arteries are patent. The basilar artery is patent. The posterior cerebral arteries are  patent. Posterior communicating arteries are diminutive or absent, bilaterally. Venous sinuses: Within the limitations of contrast timing, no convincing thrombus. Anatomic variants: As described. Review of the MIP images confirms the above findings IMPRESSION: Non-contrast head CT: 1. No evidence of an acute intracranial abnormality. 2. Minor paranasal sinus mucosal thickening. CTA neck: 1. The common carotid, internal carotid and vertebral arteries are patent within the neck without stenosis or significant atherosclerotic disease. Non-dominant and developmentally diminutive left vertebral artery. 2. Aortic Atherosclerosis (ICD10-I70.0). 3. Cervical spondylosis. CTA head: 1. No proximal intracranial large vessel occlusion or high-grade proximal arterial stenosis. 2. No intracranial aneurysm identified. Electronically Signed   By: Rockey Childs D.O.   On: 05/06/2024 15:45     Procedures   Medications Ordered in the ED  acetaminophen  (TYLENOL ) tablet 1,000 mg (1,000 mg Oral Given 05/06/24 1305)  ketorolac  (TORADOL ) 15 MG/ML injection 15 mg (15 mg Intravenous Given 05/06/24 1304)  iohexol  (OMNIPAQUE ) 350 MG/ML injection 60 mL (60 mLs Intravenous Contrast Given 05/06/24 1506)   58 y.o. male presents to the ED with complaints of headache, this involves an extensive number of treatment options, and is a complaint that carries with it a high risk of complications and morbidity.  The differential diagnosis includes subarachnoid hemorrhage, CVA, tension headache, migraine headache, meningitis, other intracranial mass or intracranial bleed.  (Ddx)  On arrival pt is nontoxic, vitals unremarkable. Exam significant for posterior headache with increased pain with neck range of motion.  Additional history obtained from manage by PCP for chronic pain management with hydrocodone  for arthritis.  I ordered medication Tylenol  and Toradol  for headache  Lab Tests:  I Ordered, reviewed, and interpreted labs, which  included: BMP and CBC.  Imaging Studies ordered:  I ordered imaging studies which included CT angio head and neck, I independently visualized and interpreted imaging which showed without acute abnormalities or intracranial bleed.  ED Course:   Patient sitting comfortably in ED bed in no acute distress and nontoxic-appearing.  Patient endorses a headache posteriorly on his head.  Initially reported headache as sudden after stepping hard on the ground and felt a jolting sensation in his head.  At that time he reported 15 out of 10 headache but did not seek medical attention.  He denied any strokelike symptoms at the time.  Today he reports that headache is a 6 out of 10 and has increased pain with neck range of motion.  Patient had no neurodeficits on neuroexam and patient has no abnormalities to gait.  Patient denies any dizziness loss of vision.  EOM normal without pain and pupils equal reactive to light.  Patient denies nausea vomiting photophobia visual disturbances and has significant history of high cholesterol.  Patient does not have history of headaches or migraines.  Initial plan is to get basic labs and CT angio head and neck.  Treat headache with Toradol  and acetaminophen   After reevaluation patient reports his headache feels much better after Toradol  and Tylenol .  No new neurosymptoms or associated symptoms reported by patient.  At time awaiting CTA.  CTA showed no acute abnormalities or concerning findings.  Patient was sitting comfortably in ED bed and reported he feels much better after the Toradol .  Patient has full range of motion of his neck.  He reports some tension in his neck when he fully looks to his left but no pain when he looks to his right.  Patient does not have fever and no recent illnesses and full range of motion of neck meningitis is not highly suspected at this time.  No signs of vascular abnormality or intracranial bleeding or masses noted on CT scan, subarachnoid  hemorrhage or CVA not suspected at this time with negative neuroexam.  Patient was advised to take naproxen  and Tylenol  as needed for pain and to monitor for worsening symptoms.  Patient was advised to monitor for worsening symptoms and given strict return precautions.  Patient agreed to treatment plan and felt comfortable with discharge.   Portions of this note were generated with Scientist, clinical (histocompatibility and immunogenetics). Dictation errors may occur despite best attempts at proofreading.    Final diagnoses:  Acute intractable headache, unspecified headache type    ED Discharge Orders          Ordered    naproxen  (NAPROSYN ) 500 MG tablet  2 times daily        05/06/24 1603               Myriam Fonda RAMAN, [place] 05/06/24 1713    Freddi Hamilton, MD 05/07/24 [phone]
